# Patient Record
Sex: Male | Born: 1945 | Race: White | Hispanic: No | Marital: Married | Smoking: Current some day smoker
Health system: Southern US, Community
[De-identification: ages and names within clinical notes are randomized; demographics above are authoritative.]

## PROBLEM LIST (undated history)

## (undated) DIAGNOSIS — E785 Hyperlipidemia, unspecified: Secondary | ICD-10-CM

## (undated) DIAGNOSIS — F17200 Nicotine dependence, unspecified, uncomplicated: Secondary | ICD-10-CM

## (undated) DIAGNOSIS — H409 Unspecified glaucoma: Secondary | ICD-10-CM

## (undated) DIAGNOSIS — I10 Essential (primary) hypertension: Secondary | ICD-10-CM

## (undated) DIAGNOSIS — C801 Malignant (primary) neoplasm, unspecified: Secondary | ICD-10-CM

## (undated) DIAGNOSIS — Z87891 Personal history of nicotine dependence: Principal | ICD-10-CM

## (undated) HISTORY — DX: Essential (primary) hypertension: I10

## (undated) HISTORY — DX: Personal history of nicotine dependence: Z87.891

## (undated) HISTORY — DX: Hyperlipidemia, unspecified: E78.5

## (undated) HISTORY — DX: Malignant (primary) neoplasm, unspecified: C80.1

## (undated) HISTORY — PX: EYE SURGERY: SHX253

## (undated) HISTORY — PX: RETINAL DETACHMENT SURGERY: SHX105

## (undated) HISTORY — DX: Nicotine dependence, unspecified, uncomplicated: F17.200

---

## 2007-01-23 ENCOUNTER — Ambulatory Visit: Payer: Self-pay | Admitting: Gastroenterology

## 2007-01-23 LAB — HM COLONOSCOPY

## 2009-01-15 ENCOUNTER — Ambulatory Visit: Payer: Self-pay | Admitting: Ophthalmology

## 2009-01-19 ENCOUNTER — Ambulatory Visit: Payer: Self-pay | Admitting: Ophthalmology

## 2009-02-09 ENCOUNTER — Ambulatory Visit: Payer: Self-pay | Admitting: Ophthalmology

## 2010-02-03 ENCOUNTER — Ambulatory Visit: Payer: Self-pay

## 2011-03-09 ENCOUNTER — Ambulatory Visit: Payer: Self-pay

## 2011-03-11 ENCOUNTER — Ambulatory Visit: Payer: Self-pay

## 2011-03-22 ENCOUNTER — Ambulatory Visit: Payer: Self-pay

## 2011-09-21 ENCOUNTER — Ambulatory Visit: Payer: Self-pay | Admitting: Ophthalmology

## 2011-09-21 LAB — POTASSIUM: Potassium: 3.9 mmol/L (ref 3.5–5.1)

## 2012-03-19 ENCOUNTER — Ambulatory Visit: Payer: Self-pay

## 2012-06-28 HISTORY — PX: PROSTATE SURGERY: SHX751

## 2013-01-21 LAB — PSA

## 2014-10-12 NOTE — Op Note (Signed)
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   PROCEDURES PERFORMED:  1. Pars plana vitrectomy of the left eye.  2. Retinal detachment of the left eye.  3. Gas exchange of the left eye.  4. Endolaser of the left eye.   SURGEON: Garlan Fair, MD  ESTIMATED BLOOD LOSS: Less than 1 mL.  ANESTHESIA: Retrobulbar block of the left eye with monitored anesthesia care.   COMPLICATIONS: None.   INDICATIONS FOR PROCEDURE: This is a patient who presented to my office with a curtain effect of his left eye. 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 Risks, benefits, and alternatives of the above procedure were discussed and the patient wished to proceed.   DESCRIPTION OF PROCEDURE: Upon informed consent, the patient was brought into the operative suite at [facility]. The patient was placed in supine position and was given a small dose of Alfenta and a retrobulbar block was performed on the left eye by the primary surgeon without any complications. The left eye was prepped and draped in sterile manner. After lid speculum was inserted, a 25-gauge trocar was placed inferotemporally through displaced conjunctiva, in an oblique fashion. The infusion cannula was turned on and inserted through the trocar and secured into position with Steri-Strips. Two more trocars were placed in a similar fashion superotemporally and superonasally. The vitreous cutter and light pipe were introduced into the eye and a core vitrectomy was performed. The vitreous face was confirmed as elevated and the vitreous was trimmed for 360 degrees out to the ora serrata. Extreme care was taken over the area of the  retinal detachment. Two small retinal breaks were identified and marked using endo cautery. The vitreous cutter was used to amputate the flap on each of these tears to make sure that there was no traction. Endo cautery was used to create a draining retinotomy superonasal to the arcades. The soft tip cannula was introduced, an air-fluid exchange was performed, and the retina was completely flattened through the draining retinotomy. Endolaser was introduced and five rows of laser were placed around the original two retinal breaks. The laser was then carried for three rows, for 360 degrees, posterior to the ora serrata and around the multiple areas of lattice degeneration. The soft tip was reintroduced, any remnant fluid was removed through the draining retinotomy, and four rows of laser was placed around the draining retinotomy. Any remnant fluid was removed and then an air gas exchange using 22% SF6 was used. The trocars were removed and two of the trocar sites were noted to be slightly leaky. These were closed using interrupted 6-0 plain gut. The eye was pressurized with SF6 to approximately 15 mmHg. 5 mg of dexamethasone was given into the inferior fornix and the lid speculum was removed. The eye was cleaned and TobraDex was placed on the eye. The patient was taken to postanesthesia care with instructions to remain head. ____________________________ Richard Henry. Richard Manns, MD mfa:slb D: 09/21/2011 14:45:03 ET T: 09/21/2011 15:12:20 ET JOB#: [id number]  cc: Richard Henry. Richard Manns, MD, <Dictator> Coralee Rud MD ELECTRONICALLY SIGNED 10/12/2011 7:18

## 2015-05-05 ENCOUNTER — Other Ambulatory Visit: Payer: Self-pay | Admitting: Unknown Physician Specialty

## 2015-05-05 ENCOUNTER — Other Ambulatory Visit: Payer: Self-pay

## 2015-05-05 MED ORDER — ATORVASTATIN CALCIUM 20 MG PO TABS: 20.0000 mg | ORAL_TABLET | Freq: Every day | ORAL | Status: DC

## 2015-05-05 NOTE — Telephone Encounter (Signed)
Pharmacy called stating that patient is saying he is completely out of medication. Patient has appointment 05/27/15 and wants to get 30 day supply sent to [facility] to get to his appointment. States he usually uses mail order pharmacy but since he is out, he needs it now.

## 2015-05-06 DIAGNOSIS — F17200 Nicotine dependence, unspecified, uncomplicated: Secondary | ICD-10-CM

## 2015-05-06 DIAGNOSIS — J449 Chronic obstructive pulmonary disease, unspecified: Secondary | ICD-10-CM

## 2015-05-06 DIAGNOSIS — J432 Centrilobular emphysema: Secondary | ICD-10-CM | POA: Insufficient documentation

## 2015-05-06 DIAGNOSIS — E785 Hyperlipidemia, unspecified: Secondary | ICD-10-CM | POA: Insufficient documentation

## 2015-05-06 DIAGNOSIS — I10 Essential (primary) hypertension: Secondary | ICD-10-CM | POA: Insufficient documentation

## 2015-05-06 DIAGNOSIS — F17210 Nicotine dependence, cigarettes, uncomplicated: Secondary | ICD-10-CM | POA: Insufficient documentation

## 2015-05-06 DIAGNOSIS — R972 Elevated prostate specific antigen [PSA]: Secondary | ICD-10-CM | POA: Insufficient documentation

## 2015-05-06 DIAGNOSIS — C61 Malignant neoplasm of prostate: Secondary | ICD-10-CM | POA: Insufficient documentation

## 2015-05-25 ENCOUNTER — Encounter: Payer: Self-pay | Admitting: Unknown Physician Specialty

## 2015-06-23 ENCOUNTER — Other Ambulatory Visit: Payer: Self-pay | Admitting: Family Medicine

## 2015-06-23 NOTE — Telephone Encounter (Signed)
Needs check further refills 

## 2015-06-23 NOTE — Telephone Encounter (Signed)
Your patient 

## 2015-06-23 NOTE — Telephone Encounter (Signed)
Called and scheduled patient an appointment for 07/14/15.

## 2015-07-14 ENCOUNTER — Ambulatory Visit (INDEPENDENT_AMBULATORY_CARE_PROVIDER_SITE_OTHER): Payer: Medicare Other | Admitting: Unknown Physician Specialty

## 2015-07-14 ENCOUNTER — Encounter: Payer: Self-pay | Admitting: Unknown Physician Specialty

## 2015-07-14 VITALS — BP 120/76 | HR 80 | Temp 97.9°F | Ht 70.1 in | Wt 189.6 lb

## 2015-07-14 DIAGNOSIS — Z23 Encounter for immunization: Secondary | ICD-10-CM | POA: Diagnosis not present

## 2015-07-14 DIAGNOSIS — E785 Hyperlipidemia, unspecified: Secondary | ICD-10-CM | POA: Diagnosis not present

## 2015-07-14 DIAGNOSIS — I10 Essential (primary) hypertension: Secondary | ICD-10-CM | POA: Diagnosis not present

## 2015-07-14 DIAGNOSIS — Z0000 Encounter for general adult medical examination without abnormal findings: Secondary | ICD-10-CM | POA: Diagnosis not present

## 2015-07-14 DIAGNOSIS — F17200 Nicotine dependence, unspecified, uncomplicated: Secondary | ICD-10-CM | POA: Diagnosis not present

## 2015-07-14 LAB — MICROALBUMIN, URINE WAIVED
CREATININE, URINE WAIVED: 200 mg/dL (ref 10–300)
MICROALB, UR WAIVED: 10 mg/L (ref 0–19)
Microalb/Creat Ratio: <30 mg/g (ref <30)

## 2015-07-14 NOTE — Assessment & Plan Note (Addendum)
Smoking periodically.  Encouraged to quit.  Set up for low dose CT screening

## 2015-07-14 NOTE — Assessment & Plan Note (Signed)
Check lipid panel  

## 2015-07-14 NOTE — Progress Notes (Signed)
BP 120/76 mmHg  Pulse 80  Temp(Src) 97.9 F (36.6 C)  Ht 5' 10.1" (1.781 m)  Wt 189 lb 9.6 oz (86.002 kg)  BMI 27.11 kg/m2  SpO2 96%   Subjective:    Patient ID: Richard Henry, male    DOB: [birth year], 70 y.o.   MRN: [id number]  HPI: Richard Henry is a 70 y.o. male  Chief Complaint  Patient presents with  . Medicare Wellness    pt would like to go get shingles vaccine   Functional Status Survey: Is the patient deaf or have difficulty hearing?: No Does the patient have difficulty seeing, even when wearing glasses/contacts?: No Does the patient have difficulty concentrating, remembering, or making decisions?: Yes (pt states he has trouble remembering sometimes) Does the patient have difficulty walking or climbing stairs?: No Does the patient have difficulty dressing or bathing?: No Does the patient have difficulty doing errands alone such as visiting a doctor's office or shopping?: No  Fall Risk  07/14/2015  Falls in the past year? No   Fall Risk  07/14/2015  Falls in the past year? No   Hypertension Using medications without difficulty Average home BPs   No problems or lightheadedness No chest pain with exertion or shortness of breath No Edema  Hyperlipidemia Using medications without problems: No Muscle aches  Diet compliance: good diet Exercise: Stays active.   Relevant past medical, surgical, family and social history reviewed and updated as indicated. Interim medical history since our last visit reviewed. Allergies and medications reviewed and updated.  Review of Systems  Constitutional: Negative.   HENT: Negative.   Eyes: Negative.   Respiratory: Negative.   Cardiovascular: Negative.   Gastrointestinal: Negative.   Endocrine: Negative.   Genitourinary: Negative.   Skin: Negative.   Allergic/Immunologic: Negative.   Neurological: Negative.   Hematological: Negative.   Psychiatric/Behavioral: Negative.     Per HPI unless specifically indicated  above     Objective:    BP 120/76 mmHg  Pulse 80  Temp(Src) 97.9 F (36.6 C)  Ht 5' 10.1" (1.781 m)  Wt 189 lb 9.6 oz (86.002 kg)  BMI 27.11 kg/m2  SpO2 96%  Wt Readings from Last 3 Encounters:  07/14/15 189 lb 9.6 oz (86.002 kg)  11/18/14 187 lb (84.823 kg)    Physical Exam  Constitutional: He is oriented to person, place, and time. He appears well-developed and well-nourished.  HENT:  Head: Normocephalic.  Right Ear: Tympanic membrane, external ear and ear canal normal.  Left Ear: Tympanic membrane, external ear and ear canal normal.  Mouth/Throat: Uvula is midline, oropharynx is clear and moist and mucous membranes are normal.  Eyes: Pupils are equal, round, and reactive to light.  Cardiovascular: Normal rate, regular rhythm and normal heart sounds.  Exam reveals no gallop and no friction rub.   No murmur heard. Pulmonary/Chest: Effort normal and breath sounds normal. No respiratory distress.  Abdominal: Soft. Bowel sounds are normal. He exhibits no distension. There is no tenderness.  Musculoskeletal: Normal range of motion.  Neurological: He is alert and oriented to person, place, and time. He has normal reflexes.  Skin: Skin is warm and dry.  Psychiatric: He has a normal mood and affect. His behavior is normal. Judgment and thought content normal.      Assessment & Plan:   Problem List Items Addressed This Visit      Unprioritized   Tobacco use disorder    Smoking periodically.  Encouraged to quit.  Set up for  low dose CT screening      Hypertension   Relevant Orders   Comprehensive metabolic panel   Microalbumin, Urine Waived   Uric acid   Hyperlipidemia    Check lipid panel      Relevant Orders   Lipid Panel w/o Chol/HDL Ratio    Other Visit Diagnoses    Immunization due    -  Primary    Relevant Orders    Flu Vaccine QUAD 36+ mos IM (Completed)    Annual physical exam        Relevant Orders    Hepatitis C antibody         Follow up  plan: Return in about 6 months (around 01/11/2016).

## 2015-07-15 LAB — COMPREHENSIVE METABOLIC PANEL
A/G RATIO: 1.6 (ref 1.1–2.5)
ALBUMIN: 3.9 g/dL (ref 3.6–4.8)
ALK PHOS: 45 IU/L (ref 39–117)
ALT: 30 IU/L (ref 0–44)
AST: 31 IU/L (ref 0–40)
BILIRUBIN TOTAL: 0.4 mg/dL (ref 0.0–1.2)
BUN / CREAT RATIO: 17 (ref 10–22)
BUN: 19 mg/dL (ref 8–27)
CO2: 27 mmol/L (ref 18–29)
CREATININE: 1.1 mg/dL (ref 0.76–1.27)
Calcium: 9.5 mg/dL (ref 8.6–10.2)
Chloride: 103 mmol/L (ref 96–106)
GFR calc Af Amer: 79 mL/min/{1.73_m2} (ref >59)
GFR calc non Af Amer: 69 mL/min/{1.73_m2} (ref >59)
GLOBULIN, TOTAL: 2.5 g/dL (ref 1.5–4.5)
Glucose: 87 mg/dL (ref 65–99)
POTASSIUM: 4.7 mmol/L (ref 3.5–5.2)
SODIUM: 144 mmol/L (ref 134–144)
Total Protein: 6.4 g/dL (ref 6.0–8.5)

## 2015-07-15 LAB — LIPID PANEL W/O CHOL/HDL RATIO
CHOLESTEROL TOTAL: 180 mg/dL (ref 100–199)
HDL: 65 mg/dL (ref >39)
LDL Calculated: 94 mg/dL (ref 0–99)
Triglycerides: 106 mg/dL (ref 0–149)
VLDL CHOLESTEROL CAL: 21 mg/dL (ref 5–40)

## 2015-07-15 LAB — URIC ACID: Uric Acid: 6.5 mg/dL (ref 3.7–8.6)

## 2015-07-15 LAB — HEPATITIS C ANTIBODY

## 2015-07-20 ENCOUNTER — Telehealth: Payer: Self-pay | Admitting: *Deleted

## 2015-07-20 NOTE — Telephone Encounter (Signed)
Received referral for low dose lung cancer screening CT scan from [facility] . Voicemail left at phone number listed in EMR for patient to call me back to facilitate scheduling scan.

## 2015-08-03 ENCOUNTER — Telehealth: Payer: Self-pay | Admitting: *Deleted

## 2015-08-03 NOTE — Telephone Encounter (Signed)
Received referral for low dose lung cancer screening CT scan from [facility] . Voicemail left at phone number listed in EMR for patient to call me back to facilitate scheduling scan.

## 2015-08-06 ENCOUNTER — Other Ambulatory Visit: Payer: Self-pay | Admitting: Family Medicine

## 2015-08-06 ENCOUNTER — Encounter: Payer: Self-pay | Admitting: Family Medicine

## 2015-08-06 DIAGNOSIS — Z87891 Personal history of nicotine dependence: Secondary | ICD-10-CM

## 2015-08-06 HISTORY — DX: Personal history of nicotine dependence: Z87.891

## 2015-08-07 ENCOUNTER — Inpatient Hospital Stay: Payer: Medicare Other | Attending: Family Medicine | Admitting: Family Medicine

## 2015-08-07 ENCOUNTER — Encounter: Payer: Self-pay | Admitting: Family Medicine

## 2015-08-07 ENCOUNTER — Ambulatory Visit: Payer: Self-pay

## 2015-08-07 ENCOUNTER — Ambulatory Visit
Admission: RE | Admit: 2015-08-07 | Discharge: 2015-08-07 | Disposition: A | Discharge status: Home / Self Care | Payer: Medicare Other | Source: Ambulatory Visit | Attending: Family Medicine | Admitting: Family Medicine

## 2015-08-07 DIAGNOSIS — Z87891 Personal history of nicotine dependence: Secondary | ICD-10-CM

## 2015-08-07 DIAGNOSIS — F17210 Nicotine dependence, cigarettes, uncomplicated: Secondary | ICD-10-CM | POA: Diagnosis not present

## 2015-08-07 DIAGNOSIS — Z122 Encounter for screening for malignant neoplasm of respiratory organs: Secondary | ICD-10-CM | POA: Diagnosis not present

## 2015-08-07 NOTE — Progress Notes (Signed)
In accordance with CMS guidelines, patient has meet eligibility criteria including age, absence of signs or symptoms of lung cancer, the specific calculation of cigarette smoking pack-years was 35 years and is a current smoker.   A shared decision-making session was conducted prior to the performance of CT scan. This includes one or more decision aids, includes benefits and harms of screening, follow-up diagnostic testing, over-diagnosis, false positive rate, and total radiation exposure.  Counseling on the importance of adherence to annual lung cancer LDCT screening, impact of co-morbidities, and ability or willingness to undergo diagnosis and treatment is imperative for compliance of the program.  Counseling on the importance of continued smoking cessation for former smokers; the importance of smoking cessation for current smokers and information about tobacco cessation interventions have been given to patient including the [place] at [facility], 1800 quit [place], as well as Cancer Center specific smoking cessation programs.  Written order for lung cancer screening with LDCT has been given to the patient and any and all questions have been answered to the best of my abilities.   Yearly follow up will be scheduled by Shawn Perkins, Thoracic Navigator.   

## 2015-08-15 ENCOUNTER — Other Ambulatory Visit: Payer: Self-pay | Admitting: Family Medicine

## 2015-08-15 MED ORDER — OSELTAMIVIR PHOSPHATE 75 MG PO CAPS: 75.0000 mg | ORAL_CAPSULE | Freq: Every day | ORAL | Status: DC

## 2015-08-15 NOTE — Progress Notes (Signed)
I called husband, explained wife (my patient) has influenza A, let's start him on Tamiflu 75 mg daily x 10 days, if he gets sick, then call us and switch to BID dosing; confirmed pharmacy

## 2015-08-26 ENCOUNTER — Other Ambulatory Visit: Payer: Self-pay

## 2015-08-26 MED ORDER — HYDROCHLOROTHIAZIDE 12.5 MG PO CAPS: 12.5000 mg | ORAL_CAPSULE | Freq: Every day | ORAL | Status: DC

## 2015-08-26 NOTE — Telephone Encounter (Signed)
Patient was last seen 07/14/15 and has appointment 01/12/16. Pharmacy is [facility].

## 2015-09-17 ENCOUNTER — Ambulatory Visit: Payer: Medicare Other | Attending: Family Medicine

## 2015-10-12 ENCOUNTER — Other Ambulatory Visit: Payer: Self-pay | Admitting: Unknown Physician Specialty

## 2015-10-20 ENCOUNTER — Telehealth: Payer: Self-pay | Admitting: *Deleted

## 2015-10-20 NOTE — Telephone Encounter (Signed)
Contacted patient who previously had shared decision making visit for lung cancer screening but has not had CT scan yet due to health issues with wife. Informed that outpatient imaging center will accommodate him any time he can come in for scan.

## 2015-11-24 ENCOUNTER — Other Ambulatory Visit: Payer: Self-pay | Admitting: Unknown Physician Specialty

## 2015-11-24 MED ORDER — ATORVASTATIN CALCIUM 20 MG PO TABS: 20.0000 mg | ORAL_TABLET | Freq: Every day | ORAL | Status: DC

## 2015-11-24 NOTE — Telephone Encounter (Signed)
Routing to provider. Patient was seen 07/14/15 and has appointment scheduled 01/12/16. Pharmacy is [facility].

## 2015-11-24 NOTE — Telephone Encounter (Signed)
Pt called stated he needs a new RX for Atorvastatin to [facility]. The old prescription has expired. Thanks.

## 2016-01-12 ENCOUNTER — Ambulatory Visit (INDEPENDENT_AMBULATORY_CARE_PROVIDER_SITE_OTHER): Payer: Medicare Other | Admitting: Unknown Physician Specialty

## 2016-01-12 ENCOUNTER — Encounter: Payer: Self-pay | Admitting: Unknown Physician Specialty

## 2016-01-12 DIAGNOSIS — I10 Essential (primary) hypertension: Secondary | ICD-10-CM | POA: Diagnosis not present

## 2016-01-12 DIAGNOSIS — E785 Hyperlipidemia, unspecified: Secondary | ICD-10-CM

## 2016-01-12 MED ORDER — ATORVASTATIN CALCIUM 20 MG PO TABS
20.0000 mg | ORAL_TABLET | Freq: Every day | ORAL | 1 refills | Status: DC
Start: 2016-01-12 — End: 2016-06-28

## 2016-01-12 MED ORDER — HYDROCHLOROTHIAZIDE 12.5 MG PO CAPS: 12.5000 mg | ORAL_CAPSULE | Freq: Every day | ORAL | 1 refills | Status: DC

## 2016-01-12 NOTE — Addendum Note (Signed)
Addended by: Kathrine Haddock on: 01/12/2016 08:48 AM   Modules accepted: Orders

## 2016-01-12 NOTE — Progress Notes (Addendum)
BP 134/86 (BP Location: Left Arm, Patient Position: Sitting, Cuff Size: Normal)   Pulse 62   Temp 97.6 F (36.4 C)   Ht 5' 10.1" (1.781 m)   Wt 176 lb 9.6 oz (80.1 kg)   SpO2 98%   BMI 25.27 kg/m    Subjective:    Patient ID: Richard Henry, male    DOB: [birth year], 70 y.o.   MRN: [id number]  HPI: Richard Henry is a 70 y.o. male  Chief Complaint  Patient presents with  . Hyperlipidemia  . Hypertension   Hypertension Using medications without difficulty Average home BPs   No problems or lightheadedness No chest pain with exertion or shortness of breath No Edema   Hyperlipidemia Using medications without problems No Muscle aches  Diet compliance: Not eating as well.   Exercise: "less in the last 6 months duet to wife's illness  Relevant past medical, surgical, family and social history reviewed and updated as indicated. Interim medical history since our last visit reviewed. Allergies and medications reviewed and updated.  Review of Systems  Per HPI unless specifically indicated above     Objective:    BP 134/86 (BP Location: Left Arm, Patient Position: Sitting, Cuff Size: Normal)   Pulse 62   Temp 97.6 F (36.4 C)   Ht 5' 10.1" (1.781 m)   Wt 176 lb 9.6 oz (80.1 kg)   SpO2 98%   BMI 25.27 kg/m   Wt Readings from Last 3 Encounters:  01/12/16 176 lb 9.6 oz (80.1 kg)  07/14/15 189 lb 9.6 oz (86 kg)  11/18/14 187 lb (84.8 kg)    Physical Exam  Constitutional: He is oriented to person, place, and time. He appears well-developed and well-nourished. No distress.  HENT:  Head: Normocephalic and atraumatic.  Eyes: Conjunctivae and lids are normal. Right eye exhibits no discharge. Left eye exhibits no discharge. No scleral icterus.  Neck: Normal range of motion. Neck supple. No JVD present. Carotid bruit is not present.  Cardiovascular: Normal rate, regular rhythm and normal heart sounds.   Pulmonary/Chest: Effort normal and breath sounds normal. No respiratory  distress.  Abdominal: Normal appearance. There is no splenomegaly or hepatomegaly.  Musculoskeletal: Normal range of motion.  Neurological: He is alert and oriented to person, place, and time.  Skin: Skin is warm, dry and intact. No rash noted. No pallor.  Psychiatric: He has a normal mood and affect. His behavior is normal. Judgment and thought content normal.    Results for orders placed or performed in visit on 07/14/15  Comprehensive metabolic panel  Result Value Ref Range   Glucose 87 65 - 99 mg/dL   BUN 19 8 - 27 mg/dL   Creatinine, Ser 1.10 0.76 - 1.27 mg/dL   GFR calc non Af Amer 69 >59 mL/min/1.73   GFR calc Af Amer 79 >59 mL/min/1.73   BUN/Creatinine Ratio 17 10 - 22   Sodium 144 134 - 144 mmol/L   Potassium 4.7 3.5 - 5.2 mmol/L   Chloride 103 96 - 106 mmol/L   CO2 27 18 - 29 mmol/L   Calcium 9.5 8.6 - 10.2 mg/dL   Total Protein 6.4 6.0 - 8.5 g/dL   Albumin 3.9 3.6 - 4.8 g/dL   Globulin, Total 2.5 1.5 - 4.5 g/dL   Albumin/Globulin Ratio 1.6 1.1 - 2.5   Bilirubin Total 0.4 0.0 - 1.2 mg/dL   Alkaline Phosphatase 45 39 - 117 IU/L   AST 31 0 - 40 IU/L   ALT  30 0 - 44 IU/L  Hepatitis C antibody  Result Value Ref Range   Hep C Virus Ab <0.1 0.0 - 0.9 s/co ratio  Lipid Panel w/o Chol/HDL Ratio  Result Value Ref Range   Cholesterol, Total 180 100 - 199 mg/dL   Triglycerides 106 0 - 149 mg/dL   HDL 65 >39 mg/dL   VLDL Cholesterol Cal 21 5 - 40 mg/dL   LDL Calculated 94 0 - 99 mg/dL  Microalbumin, Urine Waived  Result Value Ref Range   Microalb, Ur Waived 10 0 - 19 mg/L   Creatinine, Urine Waived 200 10 - 300 mg/dL   Microalb/Creat Ratio <30 <30 mg/g  Uric acid  Result Value Ref Range   Uric Acid 6.5 3.7 - 8.6 mg/dL      Assessment & Plan:   Problem List Items Addressed This Visit      Unprioritized   Hyperlipidemia    Stable, continue present medications.        Hypertension    Stable, continue present medications.         Other Visit Diagnoses     None.      Follow up plan: Return in about 6 months (around 07/14/2016) for physica;.

## 2016-01-12 NOTE — Assessment & Plan Note (Signed)
Stable, continue present medications.   

## 2016-06-28 ENCOUNTER — Other Ambulatory Visit: Payer: Self-pay | Admitting: Unknown Physician Specialty

## 2016-07-18 ENCOUNTER — Ambulatory Visit (INDEPENDENT_AMBULATORY_CARE_PROVIDER_SITE_OTHER): Payer: Medicare Other | Admitting: Unknown Physician Specialty

## 2016-07-18 ENCOUNTER — Encounter: Payer: Self-pay | Admitting: Unknown Physician Specialty

## 2016-07-18 VITALS — BP 131/79 | HR 77 | Temp 97.5°F | Ht 70.3 in | Wt 183.2 lb

## 2016-07-18 DIAGNOSIS — E782 Mixed hyperlipidemia: Secondary | ICD-10-CM

## 2016-07-18 DIAGNOSIS — Z8546 Personal history of malignant neoplasm of prostate: Secondary | ICD-10-CM | POA: Diagnosis not present

## 2016-07-18 DIAGNOSIS — Z0000 Encounter for general adult medical examination without abnormal findings: Secondary | ICD-10-CM

## 2016-07-18 DIAGNOSIS — I10 Essential (primary) hypertension: Secondary | ICD-10-CM

## 2016-07-18 DIAGNOSIS — Z0001 Encounter for general adult medical examination with abnormal findings: Secondary | ICD-10-CM | POA: Diagnosis not present

## 2016-07-18 DIAGNOSIS — Z7189 Other specified counseling: Secondary | ICD-10-CM

## 2016-07-18 NOTE — Progress Notes (Signed)
BP 131/79 (BP Location: Left Arm, Patient Position: Sitting, Cuff Size: Large)   Pulse 77   Temp 97.5 F (36.4 C)   Ht 5' 10.3" (1.786 m)   Wt 183 lb 3.2 oz (83.1 kg)   SpO2 97%   BMI 26.06 kg/m    Subjective:    Patient ID: Richard Henry, male    DOB: [birth year], 71 y.o.   MRN: [id number]  HPI: Richard Henry is a 71 y.o. male  Chief Complaint  Patient presents with  . Medicare Wellness   Functional Status Survey: Is the patient deaf or have difficulty hearing?: No Does the patient have difficulty seeing, even when wearing glasses/contacts?: No Does the patient have difficulty concentrating, remembering, or making decisions?: Yes (remembering) Does the patient have difficulty walking or climbing stairs?: No Does the patient have difficulty dressing or bathing?: No Does the patient have difficulty doing errands alone such as visiting a doctor's office or shopping?: No  Fall Risk  07/18/2016 07/14/2015  Falls in the past year? No No   Social History   Social History  . Marital status: Married    Spouse name: N/A  . Number of children: N/A  . Years of education: N/A   Occupational History  . Not on file.   Social History Main Topics  . Smoking status: Current Some Day Smoker    Packs/day: 0.00    Years: 35.00  . Smokeless tobacco: Never Used  . Alcohol use 6.0 oz/week    10 Cans of beer per week  . Drug use: No  . Sexual activity: Yes   Other Topics Concern  . Not on file   Social History Narrative  . No narrative on file   Family History  Problem Relation Age of Onset  . Hypertension Mother   . Osteoporosis Mother   . Cancer Father     lung  . Hypertension Father    Past Medical History:  Diagnosis Date  . Cancer [facility])    prostate  . COPD (chronic obstructive pulmonary disease) ([place])   . Hyperlipidemia   . Hypertension   . Personal history of tobacco use, presenting hazards to health 08/06/2015  . Tobacco use disorder    Tobacco Cut back on  smoking.  Had a low dose CT scheduled, charged for, and never got it.    Hypertension Using medications without difficulty Average home BPs Not checking  No problems or lightheadedness No chest pain with exertion or shortness of breath No Edema  Hyperlipidemia Using medications without problems: No Muscle aches  Diet compliance/Exercise: Not exercising but good diet  Relevant past medical, surgical, family and social history reviewed and updated as indicated. Interim medical history since our last visit reviewed. Allergies and medications reviewed and updated.  Review of Systems  Constitutional: Negative.   HENT: Negative.   Eyes: Negative.   Respiratory: Negative.   Cardiovascular: Negative.   Gastrointestinal: Negative.   Endocrine: Negative.   Genitourinary: Negative.   Skin: Negative.   Allergic/Immunologic: Negative.   Neurological: Negative.   Hematological: Negative.   Psychiatric/Behavioral: Negative.     Per HPI unless specifically indicated above     Objective:    BP 131/79 (BP Location: Left Arm, Patient Position: Sitting, Cuff Size: Large)   Pulse 77   Temp 97.5 F (36.4 C)   Ht 5' 10.3" (1.786 m)   Wt 183 lb 3.2 oz (83.1 kg)   SpO2 97%   BMI 26.06 kg/m   Wt Readings  from Last 3 Encounters:  07/18/16 183 lb 3.2 oz (83.1 kg)  01/12/16 176 lb 9.6 oz (80.1 kg)  07/14/15 189 lb 9.6 oz (86 kg)    Physical Exam  Constitutional: He is oriented to person, place, and time. He appears well-developed and well-nourished.  HENT:  Head: Normocephalic.  Right Ear: Tympanic membrane, external ear and ear canal normal.  Left Ear: Tympanic membrane, external ear and ear canal normal.  Mouth/Throat: Uvula is midline, oropharynx is clear and moist and mucous membranes are normal.  Eyes: Pupils are equal, round, and reactive to light.  Cardiovascular: Normal rate, regular rhythm and normal heart sounds.  Exam reveals no gallop and no friction rub.   No murmur  heard. Pulmonary/Chest: Effort normal and breath sounds normal. No respiratory distress.  Abdominal: Soft. Bowel sounds are normal. He exhibits no distension. There is no tenderness.  Musculoskeletal: Normal range of motion.  Neurological: He is alert and oriented to person, place, and time. He has normal reflexes.  Skin: Skin is warm and dry.  Psychiatric: He has a normal mood and affect. His behavior is normal. Judgment and thought content normal.    Assessment & Plan:   Problem List Items Addressed This Visit      Unprioritized   Advanced care planning/counseling discussion - Primary    Wife is current health care proxy but she would not be able to make decisions for him and needs another.  He has a living will.  No DNR.  Materials given.  Offered ACP visit      Hyperlipidemia    Stable, continue present medications.        Relevant Orders   Lipid Panel w/o Chol/HDL Ratio   Hypertension    Stable, continue present medications.        Relevant Orders   Comprehensive metabolic panel    Other Visit Diagnoses    Routine general medical examination at a health care facility       History of prostate cancer       Relevant Orders   PSA       Follow up plan: Return in about 6 months (around 01/15/2017).

## 2016-07-18 NOTE — Assessment & Plan Note (Addendum)
Wife is current health care proxy but she would not be able to make decisions for him and needs another.  He has a living will.  No DNR.  Materials given.  Offered ACP visit

## 2016-07-18 NOTE — Assessment & Plan Note (Signed)
Stable, continue present medications.   

## 2016-07-19 ENCOUNTER — Encounter: Payer: Self-pay | Admitting: Unknown Physician Specialty

## 2016-07-19 ENCOUNTER — Telehealth: Payer: Self-pay | Admitting: Unknown Physician Specialty

## 2016-07-19 LAB — COMPREHENSIVE METABOLIC PANEL
ALBUMIN: 3.9 g/dL (ref 3.6–4.8)
ALK PHOS: 55 IU/L (ref 39–117)
ALT: 17 IU/L (ref 0–44)
AST: 17 IU/L (ref 0–40)
Albumin/Globulin Ratio: 1.7 (ref 1.2–2.2)
BILIRUBIN TOTAL: 0.6 mg/dL (ref 0.0–1.2)
BUN / CREAT RATIO: 11 (ref 10–24)
BUN: 14 mg/dL (ref 8–27)
CHLORIDE: 101 mmol/L (ref 96–106)
CO2: 26 mmol/L (ref 18–29)
Calcium: 9.2 mg/dL (ref 8.6–10.2)
Creatinine, Ser: 1.23 mg/dL (ref 0.76–1.27)
GFR calc Af Amer: 69 mL/min/{1.73_m2} (ref >59)
GFR calc non Af Amer: 60 mL/min/{1.73_m2} (ref >59)
GLUCOSE: 98 mg/dL (ref 65–99)
Globulin, Total: 2.3 g/dL (ref 1.5–4.5)
Potassium: 4.3 mmol/L (ref 3.5–5.2)
Sodium: 139 mmol/L (ref 134–144)
Total Protein: 6.2 g/dL (ref 6.0–8.5)

## 2016-07-19 LAB — LIPID PANEL W/O CHOL/HDL RATIO
CHOLESTEROL TOTAL: 174 mg/dL (ref 100–199)
HDL: 50 mg/dL (ref >39)
LDL Calculated: 101 mg/dL — ABNORMAL HIGH (ref 0–99)
Triglycerides: 115 mg/dL (ref 0–149)
VLDL Cholesterol Cal: 23 mg/dL (ref 5–40)

## 2016-07-19 LAB — PSA: PROSTATE SPECIFIC AG, SERUM: 0.1 ng/mL (ref 0.0–4.0)

## 2016-07-19 NOTE — Telephone Encounter (Signed)
Labs printed and faxed

## 2016-07-19 NOTE — Progress Notes (Signed)
Normal labs.  Patient notified by letter.

## 2016-07-19 NOTE — Telephone Encounter (Signed)
Carmelina Noun, PA from [facility] needs the patients PSA levels faxed over to her.  Fax [phone]  Thank You

## 2016-07-20 ENCOUNTER — Telehealth: Payer: Self-pay | Admitting: Unknown Physician Specialty

## 2016-07-20 NOTE — Telephone Encounter (Signed)
Labs printed and faxed

## 2016-08-31 ENCOUNTER — Other Ambulatory Visit: Payer: Self-pay | Admitting: Family Medicine

## 2016-09-08 ENCOUNTER — Other Ambulatory Visit: Payer: Self-pay | Admitting: Unknown Physician Specialty

## 2017-01-16 ENCOUNTER — Ambulatory Visit (INDEPENDENT_AMBULATORY_CARE_PROVIDER_SITE_OTHER): Payer: Medicare Other | Admitting: Unknown Physician Specialty

## 2017-01-16 ENCOUNTER — Encounter: Payer: Self-pay | Admitting: Unknown Physician Specialty

## 2017-01-16 VITALS — BP 148/86 | HR 64 | Temp 97.8°F | Ht 70.7 in | Wt 189.8 lb

## 2017-01-16 DIAGNOSIS — E782 Mixed hyperlipidemia: Secondary | ICD-10-CM

## 2017-01-16 DIAGNOSIS — R252 Cramp and spasm: Secondary | ICD-10-CM

## 2017-01-16 DIAGNOSIS — I10 Essential (primary) hypertension: Secondary | ICD-10-CM | POA: Diagnosis not present

## 2017-01-16 MED ORDER — LISINOPRIL-HYDROCHLOROTHIAZIDE 10-12.5 MG PO TABS: 1.0000 | ORAL_TABLET | Freq: Every day | ORAL | 1 refills | Status: DC

## 2017-01-16 NOTE — Assessment & Plan Note (Signed)
Temporary trial off of Atorvastatin.

## 2017-01-16 NOTE — Patient Instructions (Addendum)
Magnesium Citrate or Magnesium Glycinate at night  Stop Atorvastatin for about 2 weeks and see if muscle cramps improve

## 2017-01-16 NOTE — Progress Notes (Signed)
BP (!) 148/86 (BP Location: Left Arm, Cuff Size: Normal)   Pulse 64   Temp 97.8 F (36.6 C)   Ht 5' 10.7" (1.796 m)   Wt 189 lb 12.8 oz (86.1 kg)   SpO2 97%   BMI 26.70 kg/m    Subjective:    Patient ID: Richard Henry, male    DOB: 01/20/1946, 71 y.o.   MRN: [id number]  HPI: Richard Henry is a 71 y.o. male  Chief Complaint  Patient presents with  . Hyperlipidemia  . Hypertension   Hypertension Using medications without difficulty Average home BPs 145/80's  No problems or lightheadedness No chest pain with exertion or shortness of breath No Edema   Hyperlipidemia Using medications without problems: Cramps at night Diet compliance: has been traveling and sitting and eating Exercise: walks on occasion  Pre-syncope Pt states he had 2 instances of feeling faint.  Resolved after kneeling down.    Relevant past medical, surgical, family and social history reviewed and updated as indicated. Interim medical history since our last visit reviewed. Allergies and medications reviewed and updated.  Review of Systems  Per HPI unless specifically indicated above     Objective:    BP (!) 148/86 (BP Location: Left Arm, Cuff Size: Normal)   Pulse 64   Temp 97.8 F (36.6 C)   Ht 5' 10.7" (1.796 m)   Wt 189 lb 12.8 oz (86.1 kg)   SpO2 97%   BMI 26.70 kg/m   Wt Readings from Last 3 Encounters:  01/16/17 189 lb 12.8 oz (86.1 kg)  07/18/16 183 lb 3.2 oz (83.1 kg)  01/12/16 176 lb 9.6 oz (80.1 kg)    Physical Exam  Constitutional: He is oriented to person, place, and time. He appears well-developed and well-nourished. No distress.  HENT:  Head: Normocephalic and atraumatic.  Eyes: Conjunctivae and lids are normal. Right eye exhibits no discharge. Left eye exhibits no discharge. No scleral icterus.  Neck: Normal range of motion. Neck supple. No JVD present. Carotid bruit is not present.  Cardiovascular: Normal rate, regular rhythm and normal heart sounds.   Pulmonary/Chest:  Effort normal and breath sounds normal. No respiratory distress.  Abdominal: Normal appearance. There is no splenomegaly or hepatomegaly.  Musculoskeletal: Normal range of motion.  Neurological: He is alert and oriented to person, place, and time.  Skin: Skin is warm, dry and intact. No rash noted. No pallor.  Psychiatric: He has a normal mood and affect. His behavior is normal. Judgment and thought content normal.    Results for orders placed or performed in visit on 07/18/16  Comprehensive metabolic panel  Result Value Ref Range   Glucose 98 65 - 99 mg/dL   BUN 14 8 - 27 mg/dL   Creatinine, Ser 1.23 0.76 - 1.27 mg/dL   GFR calc non Af Amer 60 >59 mL/min/1.73   GFR calc Af Amer 69 >59 mL/min/1.73   BUN/Creatinine Ratio 11 10 - 24   Sodium 139 134 - 144 mmol/L   Potassium 4.3 3.5 - 5.2 mmol/L   Chloride 101 96 - 106 mmol/L   CO2 26 18 - 29 mmol/L   Calcium 9.2 8.6 - 10.2 mg/dL   Total Protein 6.2 6.0 - 8.5 g/dL   Albumin 3.9 3.6 - 4.8 g/dL   Globulin, Total 2.3 1.5 - 4.5 g/dL   Albumin/Globulin Ratio 1.7 1.2 - 2.2   Bilirubin Total 0.6 0.0 - 1.2 mg/dL   Alkaline Phosphatase 55 39 - 117 IU/L  AST 17 0 - 40 IU/L   ALT 17 0 - 44 IU/L  PSA  Result Value Ref Range   Prostate Specific Ag, Serum 0.1 0.0 - 4.0 ng/mL  Lipid Panel w/o Chol/HDL Ratio  Result Value Ref Range   Cholesterol, Total 174 100 - 199 mg/dL   Triglycerides 115 0 - 149 mg/dL   HDL 50 >39 mg/dL   VLDL Cholesterol Cal 23 5 - 40 mg/dL   LDL Calculated 101 (H) 0 - 99 mg/dL      Assessment & Plan:   Problem List Items Addressed This Visit      Unprioritized   Hyperlipidemia    Temporary trial off of Atorvastatin.        Relevant Medications   lisinopril-hydrochlorothiazide (PRINZIDE,ZESTORETIC) 10-12.5 MG tablet   Hypertension - Primary   Relevant Medications   lisinopril-hydrochlorothiazide (PRINZIDE,ZESTORETIC) 10-12.5 MG tablet   Other Relevant Orders   Comprehensive metabolic panel   Microalbumin,  Urine Waived    Other Visit Diagnoses    Muscle cramps at night       Start Magnesium supplement QHS.  Stop Atorvastatin for 2-4 weeks.  If cramps improve restart Atorvastatin for another trial       Follow up plan: Return in about 4 weeks (around 02/13/2017).

## 2017-01-17 ENCOUNTER — Encounter: Payer: Self-pay | Admitting: Unknown Physician Specialty

## 2017-01-17 LAB — MICROALBUMIN, URINE WAIVED
Creatinine, Urine Waived: 200 mg/dL (ref 10–300)
MICROALB, UR WAIVED: 10 mg/L (ref 0–19)
Microalb/Creat Ratio: <30 mg/g (ref <30)

## 2017-01-17 LAB — COMPREHENSIVE METABOLIC PANEL
A/G RATIO: 1.7 (ref 1.2–2.2)
ALBUMIN: 4 g/dL (ref 3.5–4.8)
ALK PHOS: 55 IU/L (ref 39–117)
ALT: 20 IU/L (ref 0–44)
AST: 21 IU/L (ref 0–40)
BILIRUBIN TOTAL: 0.3 mg/dL (ref 0.0–1.2)
BUN / CREAT RATIO: 14 (ref 10–24)
BUN: 14 mg/dL (ref 8–27)
CHLORIDE: 99 mmol/L (ref 96–106)
CO2: 25 mmol/L (ref 20–29)
Calcium: 9.3 mg/dL (ref 8.6–10.2)
Creatinine, Ser: 1.02 mg/dL (ref 0.76–1.27)
GFR calc Af Amer: 86 mL/min/{1.73_m2} (ref >59)
GFR calc non Af Amer: 74 mL/min/{1.73_m2} (ref >59)
GLUCOSE: 96 mg/dL (ref 65–99)
Globulin, Total: 2.4 g/dL (ref 1.5–4.5)
POTASSIUM: 4.3 mmol/L (ref 3.5–5.2)
Sodium: 140 mmol/L (ref 134–144)
Total Protein: 6.4 g/dL (ref 6.0–8.5)

## 2017-02-14 ENCOUNTER — Ambulatory Visit (INDEPENDENT_AMBULATORY_CARE_PROVIDER_SITE_OTHER): Payer: Medicare Other | Admitting: Unknown Physician Specialty

## 2017-02-14 ENCOUNTER — Encounter: Payer: Self-pay | Admitting: Unknown Physician Specialty

## 2017-02-14 DIAGNOSIS — I10 Essential (primary) hypertension: Secondary | ICD-10-CM | POA: Diagnosis not present

## 2017-02-14 DIAGNOSIS — E782 Mixed hyperlipidemia: Secondary | ICD-10-CM | POA: Diagnosis not present

## 2017-02-14 MED ORDER — PRAVASTATIN SODIUM 40 MG PO TABS: 40.0000 mg | ORAL_TABLET | Freq: Every day | ORAL | 1 refills | Status: DC

## 2017-02-14 NOTE — Assessment & Plan Note (Signed)
Stable, continue present medications.   

## 2017-02-14 NOTE — Progress Notes (Signed)
BP 132/84   Pulse 72   Temp 97.7 F (36.5 C)   Wt 184 lb (83.5 kg)   SpO2 100%   BMI 25.88 kg/m    Subjective:    Patient ID: Richard Henry, male    DOB: [birth year], 71 y.o.   MRN: [id number]  HPI: Richard Henry is a 71 y.o. male  Chief Complaint  Patient presents with  . Follow-up    4 week f/up after being off at atorvastatin for muscle cramps   Hypertension Using medications without difficulty.  Lisinopril seems to be working Average home BPs SBP around 120  No problems or lightheadedness No chest pain with exertion or shortness of breath No Edema  Hyperlipidemia We stopped Atorvastatin for 1 month. States his leg cramps have not gone away but they seem to be dramatically improved.   Using medications without problems:  Relevant past medical, surgical, family and social history reviewed and updated as indicated. Interim medical history since our last visit reviewed. Allergies and medications reviewed and updated.  Review of Systems  Per HPI unless specifically indicated above     Objective:    BP 132/84   Pulse 72   Temp 97.7 F (36.5 C)   Wt 184 lb (83.5 kg)   SpO2 100%   BMI 25.88 kg/m   Wt Readings from Last 3 Encounters:  02/14/17 184 lb (83.5 kg)  01/16/17 189 lb 12.8 oz (86.1 kg)  07/18/16 183 lb 3.2 oz (83.1 kg)    Physical Exam  Constitutional: He is oriented to person, place, and time. He appears well-developed and well-nourished. No distress.  HENT:  Head: Normocephalic and atraumatic.  Eyes: Conjunctivae and lids are normal. Right eye exhibits no discharge. Left eye exhibits no discharge. No scleral icterus.  Neck: Normal range of motion. Neck supple. No JVD present. Carotid bruit is not present.  Cardiovascular: Normal rate, regular rhythm and normal heart sounds.   Pulmonary/Chest: Effort normal and breath sounds normal. No respiratory distress.  Abdominal: Normal appearance. There is no splenomegaly or hepatomegaly.  Musculoskeletal:  Normal range of motion.  Neurological: He is alert and oriented to person, place, and time.  Skin: Skin is warm, dry and intact. No rash noted. No pallor.  Psychiatric: He has a normal mood and affect. His behavior is normal. Judgment and thought content normal.    Results for orders placed or performed in visit on 01/16/17  Comprehensive metabolic panel  Result Value Ref Range   Glucose 96 65 - 99 mg/dL   BUN 14 8 - 27 mg/dL   Creatinine, Ser 1.02 0.76 - 1.27 mg/dL   GFR calc non Af Amer 74 >59 mL/min/1.73   GFR calc Af Amer 86 >59 mL/min/1.73   BUN/Creatinine Ratio 14 10 - 24   Sodium 140 134 - 144 mmol/L   Potassium 4.3 3.5 - 5.2 mmol/L   Chloride 99 96 - 106 mmol/L   CO2 25 20 - 29 mmol/L   Calcium 9.3 8.6 - 10.2 mg/dL   Total Protein 6.4 6.0 - 8.5 g/dL   Albumin 4.0 3.5 - 4.8 g/dL   Globulin, Total 2.4 1.5 - 4.5 g/dL   Albumin/Globulin Ratio 1.7 1.2 - 2.2   Bilirubin Total 0.3 0.0 - 1.2 mg/dL   Alkaline Phosphatase 55 39 - 117 IU/L   AST 21 0 - 40 IU/L   ALT 20 0 - 44 IU/L  Microalbumin, Urine Waived  Result Value Ref Range   Microalb, Ur Waived 10  0 - 19 mg/L   Creatinine, Urine Waived 200 10 - 300 mg/dL   Microalb/Creat Ratio <30 <30 mg/g      Assessment & Plan:   Problem List Items Addressed This Visit      Unprioritized   Hyperlipidemia    Stop Atorvastan.  Start Pravastatin 40 mg .        Relevant Medications   pravastatin (PRAVACHOL) 40 MG tablet   Hypertension    Stable, continue present medications.        Relevant Medications   pravastatin (PRAVACHOL) 40 MG tablet       Follow up plan: Return in about 3 months (around 05/17/2017).

## 2017-02-14 NOTE — Assessment & Plan Note (Signed)
Stop Atorvastan.  Start Pravastatin 40 mg .

## 2017-02-17 ENCOUNTER — Telehealth: Payer: Self-pay | Admitting: Unknown Physician Specialty

## 2017-02-17 NOTE — Telephone Encounter (Signed)
Will f/u in 3 months.  Will need a referral to neurology

## 2017-02-17 NOTE — Telephone Encounter (Signed)
Wife is here with concerns about total body spasms that lasts 30 minutes or more.  States it happens multiple times a day at night.

## 2017-02-28 ENCOUNTER — Encounter: Payer: Self-pay | Admitting: *Deleted

## 2017-02-28 ENCOUNTER — Emergency Department
Admission: EM | Admit: 2017-02-28 | Discharge: 2017-02-28 | Disposition: A | Discharge status: Home / Self Care | Payer: Medicare Other | Attending: Emergency Medicine | Admitting: Emergency Medicine

## 2017-02-28 DIAGNOSIS — L723 Sebaceous cyst: Secondary | ICD-10-CM | POA: Diagnosis not present

## 2017-02-28 DIAGNOSIS — I10 Essential (primary) hypertension: Secondary | ICD-10-CM | POA: Insufficient documentation

## 2017-02-28 DIAGNOSIS — J449 Chronic obstructive pulmonary disease, unspecified: Secondary | ICD-10-CM | POA: Insufficient documentation

## 2017-02-28 DIAGNOSIS — R222 Localized swelling, mass and lump, trunk: Secondary | ICD-10-CM | POA: Diagnosis present

## 2017-02-28 DIAGNOSIS — Z79899 Other long term (current) drug therapy: Secondary | ICD-10-CM | POA: Insufficient documentation

## 2017-02-28 DIAGNOSIS — L02211 Cutaneous abscess of abdominal wall: Secondary | ICD-10-CM | POA: Insufficient documentation

## 2017-02-28 DIAGNOSIS — L0291 Cutaneous abscess, unspecified: Secondary | ICD-10-CM

## 2017-02-28 DIAGNOSIS — F17200 Nicotine dependence, unspecified, uncomplicated: Secondary | ICD-10-CM | POA: Insufficient documentation

## 2017-02-28 MED ORDER — LIDOCAINE HCL (PF) 1 % IJ SOLN
INTRAMUSCULAR | Status: DC
Start: 2017-02-28 — End: 2017-02-28
  Filled 2017-02-28: qty 5

## 2017-02-28 MED ORDER — MUPIROCIN 2 % EX OINT: TOPICAL_OINTMENT | CUTANEOUS | 0 refills | Status: DC

## 2017-02-28 MED ORDER — CEPHALEXIN 250 MG PO CAPS: 250.0000 mg | ORAL_CAPSULE | Freq: Four times a day (QID) | ORAL | 0 refills | Status: AC

## 2017-02-28 MED ORDER — LIDOCAINE-EPINEPHRINE (PF) 1 %-1:200000 IJ SOLN
30.0000 mL | Freq: Once | INTRAMUSCULAR | Status: DC
  Filled 2017-02-28: qty 30

## 2017-02-28 MED ORDER — OXYCODONE-ACETAMINOPHEN 5-325 MG PO TABS: 1.0000 | ORAL_TABLET | Freq: Four times a day (QID) | ORAL | 0 refills | Status: DC | PRN

## 2017-02-28 NOTE — ED Notes (Signed)
Dr Jimmye Norman at bedside doing I and D.  Pt tolerated well.

## 2017-02-28 NOTE — ED Triage Notes (Signed)
Sent here by [facility] , abscess to mid abd x4-6 weeks , with pus drainage x1 week.

## 2017-02-28 NOTE — ED Provider Notes (Signed)
[facility] Emergency Department Provider Note       Time seen: ----------------------------------------- 12:11 PM on 02/28/2017 -----------------------------------------     I have reviewed the triage vital signs and the nursing notes.   HISTORY   Chief Complaint Abscess    HPI Richard Henry is a 71 y.o. male who presents to the ED for a skin abscess. Patient was sent here by [facility] and has noted an abscess to his abdominal wall for the past for 6 weeks. He said. The drainage over the last week. He denies fevers, chills or other complaints. Pain is 1 out of 10 in the abdominal wall.   Past Medical History:  Diagnosis Date  . Cancer [facility])    prostate  . Hyperlipidemia   . Hypertension   . Personal history of tobacco use, presenting hazards to health 08/06/2015  . Tobacco use disorder     Patient Active Problem List   Diagnosis Date Noted  . Advanced care planning/counseling discussion 07/18/2016  . Personal history of tobacco use, presenting hazards to health 08/06/2015  . Cancer of prostate ([place]) 05/06/2015  . Elevated prostate specific antigen (PSA) 05/06/2015  . COPD (chronic obstructive pulmonary disease) ([place]) 05/06/2015  . Tobacco use disorder 05/06/2015  . Hypertension 05/06/2015  . Hyperlipidemia 05/06/2015    Past Surgical History:  Procedure Laterality Date  . EYE SURGERY    . PROSTATE SURGERY  06/28/12  . RETINAL DETACHMENT SURGERY      Allergies Patient has no known allergies.  Social History Social History  Substance Use Topics  . Smoking status: Current Some Day Smoker    Packs/day: 0.00    Years: 35.00  . Smokeless tobacco: Never Used  . Alcohol use 1.2 oz/week    2 Cans of beer per week    Review of Systems Constitutional: Negative for fever. Musculoskeletal: Negative for back pain. Skin: positive for skin abscess Neurological: Negative for headaches, focal weakness or numbness.  All systems  negative/normal/unremarkable except as stated in the HPI  ____________________________________________   PHYSICAL EXAM:  VITAL SIGNS: ED Triage Vitals [02/28/17 1136]  Enc Vitals Group     BP 139/90     Pulse Rate 66     Resp 18     Temp 98.4 F (36.9 C)     Temp Source Oral     SpO2 100 %     Weight 184 lb (83.5 kg)     Height 5' 10.75" (1.797 m)     Head Circumference      Peak Flow      Pain Score 1     Pain Loc      Pain Edu?      Excl. in [place]?     Constitutional: Alert and oriented. Well appearing and in no distress. Eyes: Conjunctivae are normal. Normal extraocular movements. Gastrointestinal: firm area of induration and erythema approximately 5 cm in the upper mid abdomen. Musculoskeletal: Nontender with normal range of motion in extremities. No lower extremity tenderness nor edema. Neurologic:  Normal speech and language. No gross focal neurologic deficits are appreciated.  Skin:  small skin abscesses noted as dictated above ___________________________________________  ED COURSE:  Pertinent labs & imaging results that were available during my care of the patient were reviewed by me and considered in my medical decision making (see chart for details). Patient presents for an abscess that will need incision and drainage, we will incise the lesion and prescribe antibiotics.   Marland Kitchen.Incision and Drainage  Date/Time: 02/28/2017 1:04 PM Performed by: Earleen Newport Authorized by: Lenise Arena E   Consent:    Consent obtained:  Verbal   Consent given by:  Patient Location:    Type:  Abscess   Size:  5cm   Location:  Trunk   Trunk location:  Abdomen Pre-procedure details:    Skin preparation:  Betadine Anesthesia (see MAR for exact dosages):    Anesthesia method:  Local infiltration   Local anesthetic:  Lidocaine 1% WITH epi Procedure type:    Complexity:  Complex Procedure details:    Needle aspiration: no     Incision types:  Single straight    Incision depth:  Subcutaneous   Scalpel blade:  11   Wound management:  Probed and deloculated and irrigated with saline   Drainage:  Purulent   Drainage amount:  Moderate   Wound treatment:  Drain placed   Packing materials:  1/4 in gauze Post-procedure details:    Patient tolerance of procedure:  Tolerated well, no immediate complications Comments:     patient underwent incision and drainage of the abscess with also sebaceous material located in the lesion.   ____________________________________________  FINAL ASSESSMENT AND PLAN  skin abscess, sebaceous cyst   Plan: Patient had presented for an abscess and has undergone incision and drainage with good results. We have left packing in the wound and he can remove this tomorrow. He is stable for outpatient follow-up.   Earleen Newport, MD   Note: This note was generated in part or whole with voice recognition software. Voice recognition is usually quite accurate but there are transcription errors that can and very often do occur. I apologize for any typographical errors that were not detected and corrected.     Earleen Newport, MD 02/28/17 [id number]

## 2017-02-28 NOTE — ED Notes (Signed)
Pt to ed with c/o abscess to upper abd, red, approx 3 inches across.  Pt reports it has been there for 3 weeks.  Scant amount of greenish drainage noted.

## 2017-03-14 ENCOUNTER — Other Ambulatory Visit: Payer: Self-pay

## 2017-03-14 ENCOUNTER — Other Ambulatory Visit: Payer: Self-pay | Admitting: Unknown Physician Specialty

## 2017-03-14 DIAGNOSIS — I10 Essential (primary) hypertension: Secondary | ICD-10-CM

## 2017-03-14 MED ORDER — PRAVASTATIN SODIUM 40 MG PO TABS: 40.0000 mg | ORAL_TABLET | Freq: Every day | ORAL | 1 refills | Status: DC

## 2017-05-17 ENCOUNTER — Encounter: Payer: Self-pay | Admitting: Unknown Physician Specialty

## 2017-05-17 ENCOUNTER — Ambulatory Visit (INDEPENDENT_AMBULATORY_CARE_PROVIDER_SITE_OTHER): Payer: Medicare Other | Admitting: Unknown Physician Specialty

## 2017-05-17 VITALS — BP 138/81 | HR 69 | Temp 97.7°F | Wt 183.0 lb

## 2017-05-17 DIAGNOSIS — Z23 Encounter for immunization: Secondary | ICD-10-CM | POA: Diagnosis not present

## 2017-05-17 DIAGNOSIS — I10 Essential (primary) hypertension: Secondary | ICD-10-CM | POA: Diagnosis not present

## 2017-05-17 DIAGNOSIS — R252 Cramp and spasm: Secondary | ICD-10-CM | POA: Diagnosis not present

## 2017-05-17 DIAGNOSIS — E782 Mixed hyperlipidemia: Secondary | ICD-10-CM | POA: Diagnosis not present

## 2017-05-17 LAB — LIPID PANEL PICCOLO, WAIVED
CHOL/HDL RATIO PICCOLO,WAIVE: 2.7 mg/dL
Cholesterol Piccolo, Waived: 187 mg/dL (ref <200)
HDL Chol Piccolo, Waived: 68 mg/dL (ref >59)
LDL CHOL CALC PICCOLO WAIVED: 104 mg/dL — AB (ref <100)
Triglycerides Piccolo,Waived: 75 mg/dL (ref <150)
VLDL Chol Calc Piccolo,Waive: 15 mg/dL (ref <30)

## 2017-05-17 MED ORDER — PRAVASTATIN SODIUM 40 MG PO TABS: 40.0000 mg | ORAL_TABLET | Freq: Every day | ORAL | 1 refills | Status: DC

## 2017-05-17 NOTE — Assessment & Plan Note (Signed)
Tolerating Pravastatin better.  LDL is 104, same as on Atorvastatin.  Refill Pravastatin

## 2017-05-17 NOTE — Assessment & Plan Note (Addendum)
Not resolved.  Discussed Magnesium supplements for prevention.  Mustard and Vinegar for immediate relief

## 2017-05-17 NOTE — Assessment & Plan Note (Signed)
Stable, continue present medications.   

## 2017-05-17 NOTE — Progress Notes (Signed)
BP 138/81   Pulse 69   Temp 97.7 F (36.5 C) (Oral)   Wt 183 lb (83 kg)   SpO2 98%   BMI 25.70 kg/m    Subjective:    Patient ID: Richard Henry, male    DOB: Oct 26, 1945, 72 y.o.   MRN: [id number]  HPI: Richard Henry is a 71 y.o. male  Chief Complaint  Patient presents with  . Hyperlipidemia    pt states the pravastatin has been causing leg cramps as well   . Hypertension   Hypertension Using medications without difficulty Average home BPs - SBP 120's before pill   No problems or lightheadedness No chest pain with exertion or shortness of breath No Edema  Hyperlipidemia Last visit started Pravastatin as intolerant to Atorvastatin secondary to leg cramps.   Using medications without problems: Still having muscle cramps Diet compliance:Exercise:  Relevant past medical, surgical, family and social history reviewed and updated as indicated. Interim medical history since our last visit reviewed. Allergies and medications reviewed and updated.  Review of Systems  Constitutional: Negative.   Respiratory: Negative.   Cardiovascular: Negative.   Gastrointestinal: Negative.     Per HPI unless specifically indicated above     Objective:    BP 138/81   Pulse 69   Temp 97.7 F (36.5 C) (Oral)   Wt 183 lb (83 kg)   SpO2 98%   BMI 25.70 kg/m   Wt Readings from Last 3 Encounters:  05/17/17 183 lb (83 kg)  02/28/17 184 lb (83.5 kg)  02/14/17 184 lb (83.5 kg)    Physical Exam  Constitutional: He is oriented to person, place, and time. He appears well-developed and well-nourished. No distress.  HENT:  Head: Normocephalic and atraumatic.  Eyes: Conjunctivae and lids are normal. Right eye exhibits no discharge. Left eye exhibits no discharge. No scleral icterus.  Neck: Normal range of motion. Neck supple. No JVD present. Carotid bruit is not present.  Cardiovascular: Normal rate, regular rhythm and normal heart sounds.  Pulmonary/Chest: Effort normal and breath sounds  normal. No respiratory distress.  Abdominal: Normal appearance. There is no splenomegaly or hepatomegaly.  Musculoskeletal: Normal range of motion.  Neurological: He is alert and oriented to person, place, and time.  Skin: Skin is warm, dry and intact. No rash noted. No pallor.  Psychiatric: He has a normal mood and affect. His behavior is normal. Judgment and thought content normal.    Results for orders placed or performed in visit on 01/16/17  Comprehensive metabolic panel  Result Value Ref Range   Glucose 96 65 - 99 mg/dL   BUN 14 8 - 27 mg/dL   Creatinine, Ser 1.02 0.76 - 1.27 mg/dL   GFR calc non Af Amer 74 >59 mL/min/1.73   GFR calc Af Amer 86 >59 mL/min/1.73   BUN/Creatinine Ratio 14 10 - 24   Sodium 140 134 - 144 mmol/L   Potassium 4.3 3.5 - 5.2 mmol/L   Chloride 99 96 - 106 mmol/L   CO2 25 20 - 29 mmol/L   Calcium 9.3 8.6 - 10.2 mg/dL   Total Protein 6.4 6.0 - 8.5 g/dL   Albumin 4.0 3.5 - 4.8 g/dL   Globulin, Total 2.4 1.5 - 4.5 g/dL   Albumin/Globulin Ratio 1.7 1.2 - 2.2   Bilirubin Total 0.3 0.0 - 1.2 mg/dL   Alkaline Phosphatase 55 39 - 117 IU/L   AST 21 0 - 40 IU/L   ALT 20 0 - 44 IU/L  Microalbumin,  Urine Waived  Result Value Ref Range   Microalb, Ur Waived 10 0 - 19 mg/L   Creatinine, Urine Waived 200 10 - 300 mg/dL   Microalb/Creat Ratio <30 <30 mg/g      Assessment & Plan:   Problem List Items Addressed This Visit      Unprioritized   Hyperlipidemia    Tolerating Pravastatin better.  LDL is 104, same as on Atorvastatin.  Refill Pravastatin      Relevant Orders   Comprehensive metabolic panel   Lipid Panel [place], Waived   Hypertension    Stable, continue present medications.        Muscle cramps    Not resolved.  Discussed Magnesium supplements for prevention.  Mustard and Vinegar for immediate relief       Other Visit Diagnoses    Need for influenza vaccination    -  Primary   Relevant Orders   Flu vaccine HIGH DOSE PF (Completed)         Follow up plan: Return in about 6 months (around 11/14/2017).

## 2017-05-17 NOTE — Patient Instructions (Addendum)
Influenza (Flu) Vaccine (Inactivated or Recombinant): What You Need to Know 1. Why get vaccinated? Influenza ("flu") is a contagious disease that spreads around the [place] every year, usually between October and May. Flu is caused by influenza viruses, and is spread mainly by coughing, sneezing, and close contact. Anyone can get flu. Flu strikes suddenly and can last several days. Symptoms vary by age, but can include:  fever/chills  sore throat  muscle aches  fatigue  cough  headache  runny or stuffy nose  Flu can also lead to pneumonia and blood infections, and cause diarrhea and seizures in children. If you have a medical condition, such as heart or lung disease, flu can make it worse. Flu is more dangerous for some people. Infants and young children, people 23 years of age and older, pregnant women, and people with certain health conditions or a weakened immune system are at greatest risk. Each year thousands of people in the [place] die from flu, and many more are hospitalized. Flu vaccine can:  keep you from getting flu,  make flu less severe if you do get it, and  keep you from spreading flu to your family and other people. 2. Inactivated and recombinant flu vaccines A dose of flu vaccine is recommended every flu season. Children 6 months through 91 years of age may need two doses during the same flu season. Everyone else needs only one dose each flu season. Some inactivated flu vaccines contain a very small amount of a mercury-based preservative called thimerosal. Studies have not shown thimerosal in vaccines to be harmful, but flu vaccines that do not contain thimerosal are available. There is no live flu virus in flu shots. They cannot cause the flu. There are many flu viruses, and they are always changing. Each year a new flu vaccine is made to protect against three or four viruses that are likely to cause disease in the upcoming flu season. But even when the  vaccine doesn't exactly match these viruses, it may still provide some protection. Flu vaccine cannot prevent:  flu that is caused by a virus not covered by the vaccine, or  illnesses that look like flu but are not.  It takes about 2 weeks for protection to develop after vaccination, and protection lasts through the flu season. 3. Some people should not get this vaccine Tell the person who is giving you the vaccine:  If you have any severe, life-threatening allergies. If you ever had a life-threatening allergic reaction after a dose of flu vaccine, or have a severe allergy to any part of this vaccine, you may be advised not to get vaccinated. Most, but not all, types of flu vaccine contain a small amount of egg protein.  If you ever had Guillain-Barr Syndrome (also called GBS). Some people with a history of GBS should not get this vaccine. This should be discussed with your doctor.  If you are not feeling well. It is usually okay to get flu vaccine when you have a mild illness, but you might be asked to come back when you feel better.  4. Risks of a vaccine reaction With any medicine, including vaccines, there is a chance of reactions. These are usually mild and go away on their own, but serious reactions are also possible. Most people who get a flu shot do not have any problems with it. Minor problems following a flu shot include:  soreness, redness, or swelling where the shot was given  hoarseness  sore,  red or itchy eyes  cough  fever  aches  headache  itching  fatigue  If these problems occur, they usually begin soon after the shot and last 1 or 2 days. More serious problems following a flu shot can include the following:  There may be a small increased risk of Guillain-Barre Syndrome (GBS) after inactivated flu vaccine. This risk has been estimated at 1 or 2 additional cases per million people vaccinated. This is much lower than the risk of severe complications from  flu, which can be prevented by flu vaccine.  Young children who get the flu shot along with pneumococcal vaccine (PCV13) and/or DTaP vaccine at the same time might be slightly more likely to have a seizure caused by fever. Ask your doctor for more information. Tell your doctor if a child who is getting flu vaccine has ever had a seizure.  Problems that could happen after any injected vaccine:  People sometimes faint after a medical procedure, including vaccination. Sitting or lying down for about 15 minutes can help prevent fainting, and injuries caused by a fall. Tell your doctor if you feel dizzy, or have vision changes or ringing in the ears.  Some people get severe pain in the shoulder and have difficulty moving the arm where a shot was given. This happens very rarely.  Any medication can cause a severe allergic reaction. Such reactions from a vaccine are very rare, estimated at about 1 in a million doses, and would happen within a few minutes to a few hours after the vaccination. As with any medicine, there is a very remote chance of a vaccine causing a serious injury or death. The safety of vaccines is always being monitored. For more information, visit: http://www.aguilar.org/ 5. What if there is a serious reaction? What should I look for? Look for anything that concerns you, such as signs of a severe allergic reaction, very high fever, or unusual behavior. Signs of a severe allergic reaction can include hives, swelling of the face and throat, difficulty breathing, a fast heartbeat, dizziness, and weakness. These would start a few minutes to a few hours after the vaccination. What should I do?  If you think it is a severe allergic reaction or other emergency that can't wait, call 9-1-1 and get the person to the nearest hospital. Otherwise, call your doctor.  Reactions should be reported to the Vaccine Adverse Event Reporting System (VAERS). Your doctor should file this report, or you  can do it yourself through the VAERS web site at www.vaers.SamedayNews.es, or by calling 6094730752. ? VAERS does not give medical advice. 6. The National Vaccine Injury Compensation Program The [employer] Vaccine Injury Compensation Program (VICP) is a federal program that was created to compensate people who may have been injured by certain vaccines. Persons who believe they may have been injured by a vaccine can learn about the program and about filing a claim by calling 458-267-6070 or visiting the [place] website at GoldCloset.com.ee. There is a time limit to file a claim for compensation. 7. How can I learn more?  Ask your healthcare provider. He or she can give you the vaccine package insert or suggest other sources of information.  Call your local or state health department.  Contact the Centers for Disease Control and Prevention (CDC): ? Call (540)164-9661 (1-800-CDC-INFO) or ? Visit CDC's website at https://gibson.com/ Vaccine Information Statement, Inactivated Influenza Vaccine (01/24/2014) This information is not intended to replace advice given to you by your health care provider. Make sure  you discuss any questions you have with your health care provider. -------------------------------------------------- Magnesium supplement - I prefer Magnesium Citrate or Malate or Glycinate Tonic water Mustard/vinegar instant relief

## 2017-05-18 ENCOUNTER — Encounter: Payer: Self-pay | Admitting: Unknown Physician Specialty

## 2017-05-18 LAB — COMPREHENSIVE METABOLIC PANEL
ALBUMIN: 4.2 g/dL (ref 3.5–4.8)
ALT: 19 IU/L (ref 0–44)
AST: 29 IU/L (ref 0–40)
Albumin/Globulin Ratio: 1.7 (ref 1.2–2.2)
Alkaline Phosphatase: 40 IU/L (ref 39–117)
BILIRUBIN TOTAL: 0.3 mg/dL (ref 0.0–1.2)
BUN / CREAT RATIO: 13 (ref 10–24)
BUN: 14 mg/dL (ref 8–27)
CHLORIDE: 98 mmol/L (ref 96–106)
CO2: 30 mmol/L — ABNORMAL HIGH (ref 20–29)
Calcium: 10.7 mg/dL — ABNORMAL HIGH (ref 8.6–10.2)
Creatinine, Ser: 1.11 mg/dL (ref 0.76–1.27)
GFR calc non Af Amer: 67 mL/min/{1.73_m2} (ref >59)
GFR, EST AFRICAN AMERICAN: 77 mL/min/{1.73_m2} (ref >59)
GLUCOSE: 95 mg/dL (ref 65–99)
Globulin, Total: 2.5 g/dL (ref 1.5–4.5)
Potassium: 6 mmol/L — ABNORMAL HIGH (ref 3.5–5.2)
Sodium: 140 mmol/L (ref 134–144)
TOTAL PROTEIN: 6.7 g/dL (ref 6.0–8.5)

## 2017-07-19 ENCOUNTER — Encounter: Payer: Self-pay | Admitting: Family Medicine

## 2017-07-19 ENCOUNTER — Ambulatory Visit (INDEPENDENT_AMBULATORY_CARE_PROVIDER_SITE_OTHER): Payer: Medicare Other

## 2017-07-19 ENCOUNTER — Ambulatory Visit (INDEPENDENT_AMBULATORY_CARE_PROVIDER_SITE_OTHER): Payer: Medicare Other | Admitting: Family Medicine

## 2017-07-19 VITALS — BP 118/74 | HR 66 | Temp 97.8°F | Resp 97 | Ht 71.0 in | Wt 180.3 lb

## 2017-07-19 VITALS — BP 118/74 | HR 66 | Temp 97.8°F | Wt 180.3 lb

## 2017-07-19 DIAGNOSIS — Z1211 Encounter for screening for malignant neoplasm of colon: Secondary | ICD-10-CM | POA: Diagnosis not present

## 2017-07-19 DIAGNOSIS — J069 Acute upper respiratory infection, unspecified: Secondary | ICD-10-CM | POA: Diagnosis not present

## 2017-07-19 DIAGNOSIS — Z0000 Encounter for general adult medical examination without abnormal findings: Secondary | ICD-10-CM

## 2017-07-19 MED ORDER — AZITHROMYCIN 250 MG PO TABS: ORAL_TABLET | ORAL | 0 refills | Status: DC

## 2017-07-19 MED ORDER — PREDNISONE 10 MG PO TABS: ORAL_TABLET | ORAL | 0 refills | Status: DC

## 2017-07-19 NOTE — Patient Instructions (Signed)
Follow up as needed

## 2017-07-19 NOTE — Progress Notes (Signed)
Subjective:   Noris Kulinski is a 72 y.o. male who presents for Medicare Annual/Subsequent preventive examination.  Review of Systems:   Cardiac Risk Factors include: hypertension;advanced age (>22men, >30 women);male gender;dyslipidemia;smoking/ tobacco exposure     Objective:    Vitals: BP 118/74 (BP Location: Left Arm, Patient Position: Sitting)   Pulse 66   Temp 97.8 F (36.6 C) (Oral)   Resp (!) 97   Ht 5\' 11"  (1.803 m)   Wt 180 lb 4.8 oz (81.8 kg)   BMI 25.15 kg/m   Body mass index is 25.15 kg/m.  Advanced Directives 07/19/2017 02/28/2017 07/18/2016 07/14/2015 07/14/2015  Does Patient Have a Medical Advance Directive? Yes Yes Yes Yes Yes  Type of Paramedic of [place];Living will Living will;Healthcare Power of [place];Living will [place];Living will Living will;Healthcare Power of Attorney  Does patient want to make changes to medical advance directive? - - - No - Patient declined -  Copy of [place] in Chart? Yes No - copy requested No - copy requested Yes -    Tobacco Social History   Tobacco Use  Smoking Status Current Some Day Smoker  . Packs/day: 0.00  . Years: 35.00  . Pack years: 0.00  . Types: Cigarettes  Smokeless Tobacco Never Used     Ready to quit: Yes Counseling given: Yes   Clinical Intake:  Pre-visit preparation completed: Yes  Pain : No/denies pain     Nutritional Status: BMI 25 -29 Overweight Nutritional Risks: None Diabetes: No  How often do you need to have someone help you when you read instructions, pamphlets, or other written materials from your doctor or pharmacy?: 1 - Never What is the last grade level you completed in school?: some college   Interpreter Needed?: No  Information entered by :: TIffany Hill,LPN  Past Medical History:  Diagnosis Date  . Cancer [facility])    prostate  . Hyperlipidemia   . Hypertension   . Personal  history of tobacco use, presenting hazards to health 08/06/2015  . Tobacco use disorder    Past Surgical History:  Procedure Laterality Date  . EYE SURGERY    . PROSTATE SURGERY  06/28/12  . RETINAL DETACHMENT SURGERY     Family History  Problem Relation Age of Onset  . Hypertension Mother   . Osteoporosis Mother   . Cancer Father        lung  . Hypertension Father    Social History   Socioeconomic History  . Marital status: Married    Spouse name: None  . Number of children: None  . Years of education: None  . Highest education level: None  Social Needs  . Financial resource strain: Not hard at all  . Food insecurity - worry: Never true  . Food insecurity - inability: Never true  . Transportation needs - medical: No  . Transportation needs - non-medical: No  Occupational History  . None  Tobacco Use  . Smoking status: Current Some Day Smoker    Packs/day: 0.00    Years: 35.00    Pack years: 0.00    Types: Cigarettes  . Smokeless tobacco: Never Used  Substance and Sexual Activity  . Alcohol use: Yes    Alcohol/week: 1.2 oz    Types: 2 Cans of beer per week  . Drug use: No  . Sexual activity: Yes  Other Topics Concern  . None  Social History Narrative  .  None    Outpatient Encounter Medications as of 07/19/2017  Medication Sig  . lisinopril-hydrochlorothiazide (PRINZIDE,ZESTORETIC) 10-12.5 MG tablet TAKE 1 TABLET BY MOUTH  DAILY  . Multiple Vitamin (MULTIVITAMIN) tablet Take 1 tablet by mouth daily.  . pravastatin (PRAVACHOL) 40 MG tablet Take 1 tablet (40 mg total) by mouth daily.  . timolol (TIMOPTIC) 0.5 % ophthalmic solution Administer 1 drop to both eyes daily.    No facility-administered encounter medications on file as of 07/19/2017.     Activities of Daily Living In your present state of health, do you have any difficulty performing the following activities: 07/19/2017  Hearing? N  Vision? N  Difficulty concentrating or making decisions? N  Walking  or climbing stairs? N  Dressing or bathing? N  Doing errands, shopping? N  Preparing Food and eating ? N  Using the Toilet? N  In the past six months, have you accidently leaked urine? N  Do you have problems with loss of bowel control? N  Managing your Medications? N  Managing your Finances? N  Housekeeping or managing your Housekeeping? N  Some recent data might be hidden    Patient Care Team: Kathrine Haddock, NP as PCP - General (Nurse Practitioner) Kathrine Haddock, NP as PCP - Family Medicine (Nurse Practitioner) Estill Cotta, MD (Ophthalmology)   Assessment:   This is a routine wellness examination for [place].  Exercise Activities and Dietary recommendations Current Exercise Habits: The patient does not participate in regular exercise at present, Exercise limited by: respiratory conditions(s)  Goals    None      Fall Risk Fall Risk  07/19/2017 07/18/2016 07/14/2015  Falls in the past year? No No No   Is the patient's home free of loose throw rugs in walkways, pet beds, electrical cords, etc?   yes      Grab bars in the bathroom? no      Handrails on the stairs?   yes      Adequate lighting?   yes  Timed Get Up and Go Performed: Completed in 8 seconds with no use of assistive devices, steady gait. No intervention needed at this time.   Depression Screen PHQ 2/9 Scores 07/19/2017 07/18/2016 07/14/2015  PHQ - 2 Score 0 1 0  PHQ- 9 Score - 3 -    Cognitive Function     6CIT Screen 07/19/2017  What Year? 0 points  What month? 0 points  What time? 0 points  Count back from 20 0 points  Months in reverse 0 points  Repeat phrase 0 points  Total Score 0    Immunization History  Administered Date(s) Administered  . Influenza, High Dose Seasonal PF 05/17/2017  . Influenza,inj,Quad PF,6+ Mos 07/14/2015  . Pneumococcal Conjugate-13 02/03/2014  . Pneumococcal Polysaccharide-23 03/14/2012  . Tdap 01/25/2010  . Zoster 07/14/2015    Qualifies for Shingles Vaccine?  Up to date  Screening Tests Health Maintenance  Topic Date Due  . COLONOSCOPY  01/22/2017  . TETANUS/TDAP  01/26/2020  . INFLUENZA VACCINE  Completed  . Hepatitis C Screening  Completed  . PNA vac Low Risk Adult  Completed   Cancer Screenings: Lung: Low Dose CT Chest recommended if Age 51-80 years, 30 pack-year currently smoking OR have quit w/in 15years. Patient does qualify. Order in system expired Colorectal: due now- referral sent to GI   Additional Screenings: Hepatitis B/HIV/Syphillis: not indicated Hepatitis C Screening: completed 07/14/2015    Plan:    I have personally reviewed and addressed the Medicare  Annual Wellness questionnaire and have noted the following in the patient's chart:  A. Medical and social history B. Use of alcohol, tobacco or illicit drugs  C. Current medications and supplements D. Functional ability and status E.  Nutritional status F.  Physical activity G. Advance directives H. List of other physicians I.  Hospitalizations, surgeries, and ER visits in previous 12 months J.  [place] such as hearing and vision if needed, cognitive and depression L. Referrals and appointments   In addition, I have reviewed and discussed with patient certain preventive protocols, quality metrics, and best practice recommendations. A written personalized care plan for preventive services as well as general preventive health recommendations were provided to patient.   Signed,  Tyler Aas, LPN Nurse Health Advisor   Nurse Notes:none

## 2017-07-19 NOTE — Progress Notes (Signed)
BP 118/74 (BP Location: Left Arm, Patient Position: Sitting)   Pulse 66   Temp 97.8 F (36.6 C) (Temporal)   Wt 180 lb 4.8 oz (81.8 kg)   SpO2 97%   BMI 25.15 kg/m    Subjective:    Patient ID: Richard Henry, male    DOB: Apr 30, 1946, 72 y.o.   MRN: [id number]  HPI: Richard Henry is a 72 y.o. male  Chief Complaint  Patient presents with  . Nasal Congestion    x1 month  . Cough   About a month of productive, hacking cough that is worst at night when laying down. Sometimes gets choked on all the phegm. Having some SOB but no wheezing. Been taking mucinex x 1 month with no relief there. Denies fevers, chills, body aches, CP. Traveling across the country in a few days.   Relevant past medical, surgical, family and social history reviewed and updated as indicated. Interim medical history since our last visit reviewed. Allergies and medications reviewed and updated.  Review of Systems  Constitutional: Negative.   HENT: Positive for congestion.   Respiratory: Positive for cough, chest tightness and shortness of breath.   Cardiovascular: Negative.   Gastrointestinal: Negative.   Genitourinary: Negative.   Musculoskeletal: Negative.   Neurological: Negative.   Psychiatric/Behavioral: Negative.    Per HPI unless specifically indicated above     Objective:    BP 118/74 (BP Location: Left Arm, Patient Position: Sitting)   Pulse 66   Temp 97.8 F (36.6 C) (Temporal)   Wt 180 lb 4.8 oz (81.8 kg)   SpO2 97%   BMI 25.15 kg/m   Wt Readings from Last 3 Encounters:  07/19/17 180 lb 4.8 oz (81.8 kg)  07/19/17 180 lb 4.8 oz (81.8 kg)  05/17/17 183 lb (83 kg)    Physical Exam  Constitutional: He is oriented to person, place, and time. He appears well-developed and well-nourished. No distress.  HENT:  Head: Atraumatic.  Right Ear: External ear normal.  Left Ear: External ear normal.  Nose: Nose normal.  Mouth/Throat: No oropharyngeal exudate.  Oropharynx erythematous posteriorly   Eyes: Conjunctivae are normal. No scleral icterus.  Neck: Normal range of motion. Neck supple.  Cardiovascular: Normal rate and normal heart sounds.  Pulmonary/Chest: Effort normal. No respiratory distress. He has wheezes (minimal).  Musculoskeletal: Normal range of motion.  Neurological: He is alert and oriented to person, place, and time.  Skin: Skin is warm and dry.  Psychiatric: He has a normal mood and affect. His behavior is normal.  Nursing note and vitals reviewed.   Results for orders placed or performed in visit on 05/17/17  Comprehensive metabolic panel  Result Value Ref Range   Glucose 95 65 - 99 mg/dL   BUN 14 8 - 27 mg/dL   Creatinine, Ser 1.11 0.76 - 1.27 mg/dL   GFR calc non Af Amer 67 >59 mL/min/1.73   GFR calc Af Amer 77 >59 mL/min/1.73   BUN/Creatinine Ratio 13 10 - 24   Sodium 140 134 - 144 mmol/L   Potassium 6.0 (H) 3.5 - 5.2 mmol/L   Chloride 98 96 - 106 mmol/L   CO2 30 (H) 20 - 29 mmol/L   Calcium 10.7 (H) 8.6 - 10.2 mg/dL   Total Protein 6.7 6.0 - 8.5 g/dL   Albumin 4.2 3.5 - 4.8 g/dL   Globulin, Total 2.5 1.5 - 4.5 g/dL   Albumin/Globulin Ratio 1.7 1.2 - 2.2   Bilirubin Total 0.3 0.0 - 1.2 mg/dL  Alkaline Phosphatase 40 39 - 117 IU/L   AST 29 0 - 40 IU/L   ALT 19 0 - 44 IU/L  Lipid Panel Piccolo, Waived  Result Value Ref Range   Cholesterol Piccolo, Waived 187 <200 mg/dL   HDL Chol Piccolo, Waived 68 >59 mg/dL   Triglycerides Piccolo,Waived 75 <150 mg/dL   Chol/HDL Ratio Piccolo,Waive 2.7 mg/dL   LDL Chol Calc Piccolo Waived 104 (H) <100 mg/dL   VLDL Chol Calc Piccolo,Waive 15 <30 mg/dL      Assessment & Plan:   Problem List Items Addressed This Visit    None    Visit Diagnoses    Upper respiratory tract infection, unspecified type    -  Primary   Will tx with zpack and prednisone taper. Continue plain mucinex, supportive care reviewed with humidifier, vapor rubs, propping up at night. F/u if no better   Relevant Medications    azithromycin (ZITHROMAX) 250 MG tablet       Follow up plan: Return for as scheduled.

## 2017-07-19 NOTE — Patient Instructions (Addendum)
[name] , Thank you for taking time to come for your Medicare Wellness Visit. I appreciate your ongoing commitment to your health goals. Please review the following plan we discussed and let me know if I can assist you in the future.   Screening recommendations/referrals: Colonoscopy: due now- referral ordered for GI Recommended yearly ophthalmology/optometry visit for glaucoma screening and checkup Recommended yearly dental visit for hygiene and checkup  Vaccinations: Influenza vaccine: up to date Pneumococcal vaccine: up to date Tdap vaccine: up to date Shingles vaccine: up to date  Advanced directives: copy on file  Conditions/risks identified: smoking cessation discussed  Next appointment: Follow up in one year for your annual wellness exam.   Preventive Care 71 Years and Older, Male Preventive care refers to lifestyle choices and visits with your health care provider that can promote health and wellness. What does preventive care include?  A yearly physical exam. This is also called an annual well check.  Dental exams once or twice a year.  Routine eye exams. Ask your health care provider how often you should have your eyes checked.  Personal lifestyle choices, including:  Daily care of your teeth and gums.  Regular physical activity.  Eating a healthy diet.  Avoiding tobacco and drug use.  Limiting alcohol use.  Practicing safe sex.  Taking low doses of aspirin every day.  Taking vitamin and mineral supplements as recommended by your health care provider. What happens during an annual well check? The services and screenings done by your health care provider during your annual well check will depend on your age, overall health, lifestyle risk factors, and family history of disease. Counseling  Your health care provider may ask you questions about your:  Alcohol use.  Tobacco use.  Drug use.  Emotional well-being.  Home and relationship  well-being.  Sexual activity.  Eating habits.  History of falls.  Memory and ability to understand (cognition).  Work and work Statistician. Screening  You may have the following tests or measurements:  Height, weight, and BMI.  Blood pressure.  Lipid and cholesterol levels. These may be checked every 5 years, or more frequently if you are over 88 years old.  Skin check.  Lung cancer screening. You may have this screening every year starting at age 26 if you have a 30-pack-year history of smoking and currently smoke or have quit within the past 15 years.  Fecal occult blood test (FOBT) of the stool. You may have this test every year starting at age 53.  Flexible sigmoidoscopy or colonoscopy. You may have a sigmoidoscopy every 5 years or a colonoscopy every 10 years starting at age 39.  Prostate cancer screening. Recommendations will vary depending on your family history and other risks.  Hepatitis C blood test.  Hepatitis B blood test.  Sexually transmitted disease (STD) testing.  Diabetes screening. This is done by checking your blood sugar (glucose) after you have not eaten for a while (fasting). You may have this done every 1-3 years.  Abdominal aortic aneurysm (AAA) screening. You may need this if you are a current or former smoker.  Osteoporosis. You may be screened starting at age 5 if you are at high risk. Talk with your health care provider about your test results, treatment options, and if necessary, the need for more tests. Vaccines  Your health care provider may recommend certain vaccines, such as:  Influenza vaccine. This is recommended every year.  Tetanus, diphtheria, and acellular pertussis (Tdap, Td) vaccine. You may  need a Td booster every 10 years.  Zoster vaccine. You may need this after age 76.  Pneumococcal 13-valent conjugate (PCV13) vaccine. One dose is recommended after age 76.  Pneumococcal polysaccharide (PPSV23) vaccine. One dose is  recommended after age 17. Talk to your health care provider about which screenings and vaccines you need and how often you need them. This information is not intended to replace advice given to you by your health care provider. Make sure you discuss any questions you have with your health care provider. Document Released: 07/03/2015 Document Revised: 02/24/2016 Document Reviewed: 04/07/2015 Elsevier Interactive Patient Education  2017 [place] Prevention in the Home Falls can cause injuries. They can happen to people of all ages. There are many things you can do to make your home safe and to help prevent falls. What can I do on the outside of my home?  Regularly fix the edges of walkways and driveways and fix any cracks.  Remove anything that might make you trip as you walk through a door, such as a raised step or threshold.  Trim any bushes or trees on the path to your home.  Use bright outdoor lighting.  Clear any walking paths of anything that might make someone trip, such as rocks or tools.  Regularly check to see if handrails are loose or broken. Make sure that both sides of any steps have handrails.  Any raised decks and porches should have guardrails on the edges.  Have any leaves, snow, or ice cleared regularly.  Use sand or salt on walking paths during winter.  Clean up any spills in your garage right away. This includes oil or grease spills. What can I do in the bathroom?  Use night lights.  Install grab bars by the toilet and in the tub and shower. Do not use towel bars as grab bars.  Use non-skid mats or decals in the tub or shower.  If you need to sit down in the shower, use a plastic, non-slip stool.  Keep the floor dry. Clean up any water that spills on the floor as soon as it happens.  Remove soap buildup in the tub or shower regularly.  Attach bath mats securely with double-sided non-slip rug tape.  Do not have throw rugs and other things on  the floor that can make you trip. What can I do in the bedroom?  Use night lights.  Make sure that you have a light by your bed that is easy to reach.  Do not use any sheets or blankets that are too big for your bed. They should not hang down onto the floor.  Have a firm chair that has side arms. You can use this for support while you get dressed.  Do not have throw rugs and other things on the floor that can make you trip. What can I do in the kitchen?  Clean up any spills right away.  Avoid walking on wet floors.  Keep items that you use a lot in easy-to-reach places.  If you need to reach something above you, use a strong step stool that has a grab bar.  Keep electrical cords out of the way.  Do not use floor polish or wax that makes floors slippery. If you must use wax, use non-skid floor wax.  Do not have throw rugs and other things on the floor that can make you trip. What can I do with my stairs?  Do not leave any items on  the stairs.  Make sure that there are handrails on both sides of the stairs and use them. Fix handrails that are broken or loose. Make sure that handrails are as long as the stairways.  Check any carpeting to make sure that it is firmly attached to the stairs. Fix any carpet that is loose or worn.  Avoid having throw rugs at the top or bottom of the stairs. If you do have throw rugs, attach them to the floor with carpet tape.  Make sure that you have a light switch at the top of the stairs and the bottom of the stairs. If you do not have them, ask someone to add them for you. What else can I do to help prevent falls?  Wear shoes that:  Do not have high heels.  Have rubber bottoms.  Are comfortable and fit you well.  Are closed at the toe. Do not wear sandals.  If you use a stepladder:  Make sure that it is fully opened. Do not climb a closed stepladder.  Make sure that both sides of the stepladder are locked into place.  Ask someone to  hold it for you, if possible.  Clearly mark and make sure that you can see:  Any grab bars or handrails.  First and last steps.  Where the edge of each step is.  Use tools that help you move around (mobility aids) if they are needed. These include:  Canes.  Walkers.  Scooters.  Crutches.  Turn on the lights when you go into a dark area. Replace any light bulbs as soon as they burn out.  Set up your furniture so you have a clear path. Avoid moving your furniture around.  If any of your floors are uneven, fix them.  If there are any pets around you, be aware of where they are.  Review your medicines with your doctor. Some medicines can make you feel dizzy. This can increase your chance of falling. Ask your doctor what other things that you can do to help prevent falls. This information is not intended to replace advice given to you by your health care provider. Make sure you discuss any questions you have with your health care provider. Document Released: 04/02/2009 Document Revised: 11/12/2015 Document Reviewed: 07/11/2014 Elsevier Interactive Patient Education  2017 [employer].  Steps to Quit Smoking Smoking tobacco can be bad for your health. It can also affect almost every organ in your body. Smoking puts you and people around you at risk for many serious long-lasting (chronic) diseases. Quitting smoking is hard, but it is one of the best things that you can do for your health. It is never too late to quit. What are the benefits of quitting smoking? When you quit smoking, you lower your risk for getting serious diseases and conditions. They can include:  Lung cancer or lung disease.  Heart disease.  Stroke.  Heart attack.  Not being able to have children (infertility).  Weak bones (osteoporosis) and broken bones (fractures).  If you have coughing, wheezing, and shortness of breath, those symptoms may get better when you quit. You may also get sick less often.  If you are pregnant, quitting smoking can help to lower your chances of having a baby of low birth weight. What can I do to help me quit smoking? Talk with your doctor about what can help you quit smoking. Some things you can do (strategies) include:  Quitting smoking totally, instead of slowly cutting back how  much you smoke over a period of time.  Going to in-person counseling. You are more likely to quit if you go to many counseling sessions.  Using resources and support systems, such as: ? Database administrator with a Social worker. ? Phone quitlines. ? Careers information officer. ? Support groups or group counseling. ? Text messaging programs. ? Mobile phone apps or applications.  Taking medicines. Some of these medicines may have nicotine in them. If you are pregnant or breastfeeding, do not take any medicines to quit smoking unless your doctor says it is okay. Talk with your doctor about counseling or other things that can help you.  Talk with your doctor about using more than one strategy at the same time, such as taking medicines while you are also going to in-person counseling. This can help make quitting easier. What things can I do to make it easier to quit? Quitting smoking might feel very hard at first, but there is a lot that you can do to make it easier. Take these steps:  Talk to your family and friends. Ask them to support and encourage you.  Call phone quitlines, reach out to support groups, or work with a Social worker.  Ask people who smoke to not smoke around you.  Avoid places that make you want (trigger) to smoke, such as: ? Bars. ? Parties. ? Smoke-break areas at work.  Spend time with people who do not smoke.  Lower the stress in your life. Stress can make you want to smoke. Try these things to help your stress: ? Getting regular exercise. ? Deep-breathing exercises. ? Yoga. ? Meditating. ? Doing a body scan. To do this, close your eyes, focus on one area of your  body at a time from head to toe, and notice which parts of your body are tense. Try to relax the muscles in those areas.  Download or buy apps on your mobile phone or tablet that can help you stick to your quit plan. There are many free apps, such as QuitGuide from the [employer] Office manager for Disease Control and Prevention). You can find more support from smokefree.gov and other websites.  This information is not intended to replace advice given to you by your health care provider. Make sure you discuss any questions you have with your health care provider. Document Released: 04/02/2009 Document Revised: 02/02/2016 Document Reviewed: 10/21/2014 Elsevier Interactive Patient Education  2018 [employer].

## 2017-07-21 ENCOUNTER — Other Ambulatory Visit: Payer: Self-pay

## 2017-07-21 DIAGNOSIS — Z1211 Encounter for screening for malignant neoplasm of colon: Secondary | ICD-10-CM

## 2017-08-30 ENCOUNTER — Telehealth: Payer: Self-pay | Admitting: *Deleted

## 2017-08-30 DIAGNOSIS — Z122 Encounter for screening for malignant neoplasm of respiratory organs: Secondary | ICD-10-CM

## 2017-08-30 DIAGNOSIS — Z87891 Personal history of nicotine dependence: Secondary | ICD-10-CM

## 2017-08-30 NOTE — Telephone Encounter (Signed)
Received referral for initial lung cancer screening scan. Contacted patient and obtained smoking history,(current, 36 pack year) as well as answering questions related to screening process. Patient denies signs of lung cancer such as weight loss or hemoptysis. Patient denies comorbidity that would prevent curative treatment if lung cancer were found. Shared Decision Making Visit was done 08/07/15 but this will be the initial lung screening CT scan.

## 2017-09-05 ENCOUNTER — Encounter
Admission: RE | Disposition: A | Discharge status: Home / Self Care | Payer: Self-pay | Source: Ambulatory Visit | Attending: Gastroenterology

## 2017-09-05 ENCOUNTER — Ambulatory Visit
Admission: RE | Admit: 2017-09-05 | Discharge: 2017-09-05 | Disposition: A | Discharge status: Home / Self Care | Payer: Medicare Other | Source: Ambulatory Visit | Attending: Gastroenterology | Admitting: Gastroenterology

## 2017-09-05 ENCOUNTER — Encounter: Payer: Self-pay | Admitting: *Deleted

## 2017-09-05 ENCOUNTER — Ambulatory Visit: Payer: Medicare Other | Admitting: Anesthesiology

## 2017-09-05 DIAGNOSIS — K5730 Diverticulosis of large intestine without perforation or abscess without bleeding: Secondary | ICD-10-CM | POA: Insufficient documentation

## 2017-09-05 DIAGNOSIS — I10 Essential (primary) hypertension: Secondary | ICD-10-CM | POA: Diagnosis not present

## 2017-09-05 DIAGNOSIS — Z1211 Encounter for screening for malignant neoplasm of colon: Secondary | ICD-10-CM | POA: Diagnosis not present

## 2017-09-05 DIAGNOSIS — F17210 Nicotine dependence, cigarettes, uncomplicated: Secondary | ICD-10-CM | POA: Insufficient documentation

## 2017-09-05 DIAGNOSIS — D125 Benign neoplasm of sigmoid colon: Secondary | ICD-10-CM

## 2017-09-05 DIAGNOSIS — D124 Benign neoplasm of descending colon: Secondary | ICD-10-CM

## 2017-09-05 DIAGNOSIS — Z79899 Other long term (current) drug therapy: Secondary | ICD-10-CM | POA: Insufficient documentation

## 2017-09-05 DIAGNOSIS — J449 Chronic obstructive pulmonary disease, unspecified: Secondary | ICD-10-CM | POA: Diagnosis not present

## 2017-09-05 DIAGNOSIS — K635 Polyp of colon: Secondary | ICD-10-CM | POA: Diagnosis not present

## 2017-09-05 HISTORY — DX: Unspecified glaucoma: H40.9

## 2017-09-05 HISTORY — PX: COLONOSCOPY WITH PROPOFOL: SHX5780

## 2017-09-05 SURGERY — COLONOSCOPY WITH PROPOFOL
Anesthesia: Choice

## 2017-09-05 SURGERY — COLONOSCOPY WITH PROPOFOL
Anesthesia: General

## 2017-09-05 MED ORDER — GLYCOPYRROLATE 0.2 MG/ML IJ SOLN
INTRAMUSCULAR | Status: DC | PRN
  Administered 2017-09-05: 0.1 mg via INTRAVENOUS

## 2017-09-05 MED ORDER — SODIUM CHLORIDE 0.9 % IV SOLN
INTRAVENOUS | Status: DC
  Administered 2017-09-05: 1000 mL via INTRAVENOUS

## 2017-09-05 MED ORDER — PROPOFOL 500 MG/50ML IV EMUL
INTRAVENOUS | Status: DC | PRN
  Administered 2017-09-05: 140 ug/kg/min via INTRAVENOUS

## 2017-09-05 MED ORDER — GLYCOPYRROLATE 0.2 MG/ML IJ SOLN
INTRAMUSCULAR | Status: AC
  Filled 2017-09-05: qty 1

## 2017-09-05 MED ORDER — PROPOFOL 10 MG/ML IV BOLUS
INTRAVENOUS | Status: DC | PRN
  Administered 2017-09-05: 30 mg via INTRAVENOUS
  Administered 2017-09-05: 70 mg via INTRAVENOUS

## 2017-09-05 NOTE — Anesthesia Postprocedure Evaluation (Signed)
Anesthesia Post Note  Patient: Richard Henry  Procedure(s) Performed: COLONOSCOPY WITH PROPOFOL (N/A )  Patient location during evaluation: Endoscopy Anesthesia Type: General Level of consciousness: awake and alert Pain management: pain level controlled Vital Signs Assessment: post-procedure vital signs reviewed and stable Respiratory status: spontaneous breathing, nonlabored ventilation, respiratory function stable and patient connected to nasal cannula oxygen Cardiovascular status: blood pressure returned to baseline and stable Postop Assessment: no apparent nausea or vomiting Anesthetic complications: no     Last Vitals:  Vitals:   09/05/17 0920 09/05/17 0930  BP: (!) 141/73 (!) 147/77  Pulse: 81 80  Resp: 18 (!) 25  Temp:    SpO2: 100% 100%    Last Pain:  Vitals:   09/05/17 0910  TempSrc: Tympanic                 Towanna Avery S

## 2017-09-05 NOTE — H&P (Signed)
Lucilla Lame, MD [facility] [address] [place], [place] [address] Phone: [phone] Fax : [phone]  Primary Care Physician:  Kathrine Haddock, NP Primary Gastroenterologist:  Dr. Allen Norris  Pre-Procedure History & Physical: HPI:  Richard Henry is a 72 y.o. male is here for a screening colonoscopy.   Past Medical History:  Diagnosis Date  . Cancer [facility])    prostate  . Glaucoma   . Hyperlipidemia   . Hypertension   . Personal history of tobacco use, presenting hazards to health 08/06/2015  . Tobacco use disorder     Past Surgical History:  Procedure Laterality Date  . EYE SURGERY    . PROSTATE SURGERY  06/28/12  . RETINAL DETACHMENT SURGERY      Prior to Admission medications   Medication Sig Start Date End Date Taking? Authorizing Provider  timolol (TIMOPTIC) 0.5 % ophthalmic solution Administer 1 drop to both eyes daily.  07/04/13  Yes [provider]  lisinopril-hydrochlorothiazide (PRINZIDE,ZESTORETIC) 10-12.5 MG tablet TAKE 1 TABLET BY MOUTH  DAILY 03/14/17   Kathrine Haddock, NP  Multiple Vitamin (MULTIVITAMIN) tablet Take 1 tablet by mouth daily.    [provider]  pravastatin (PRAVACHOL) 40 MG tablet Take 1 tablet (40 mg total) by mouth daily. 05/17/17   Kathrine Haddock, NP    Allergies as of 07/21/2017  . (No Known Allergies)    Family History  Problem Relation Age of Onset  . Hypertension Mother   . Osteoporosis Mother   . Cancer Father        lung  . Hypertension Father     Social History   Socioeconomic History  . Marital status: Married    Spouse name: Not on file  . Number of children: Not on file  . Years of education: Not on file  . Highest education level: Not on file  Social Needs  . Financial resource strain: Not hard at all  . Food insecurity - worry: Never true  . Food insecurity - inability: Never true  . Transportation needs - medical: No  . Transportation needs - non-medical: No  Occupational History  . Not on  file  Tobacco Use  . Smoking status: Current Some Day Smoker    Packs/day: 0.00    Years: 35.00    Pack years: 0.00    Types: Cigarettes  . Smokeless tobacco: Never Used  Substance and Sexual Activity  . Alcohol use: Yes    Alcohol/week: 1.2 oz    Types: 2 Cans of beer per week  . Drug use: No  . Sexual activity: Yes  Other Topics Concern  . Not on file  Social History Narrative  . Not on file    Review of Systems: See HPI, otherwise negative ROS  Physical Exam: BP 128/79   Pulse 77   Temp (!) 97.1 F (36.2 C) (Tympanic)   Resp 20   Ht 5\' 11"  (1.803 m)   Wt 174 lb (78.9 kg)   SpO2 100%   BMI 24.27 kg/m  General:   Alert,  pleasant and cooperative in NAD Head:  Normocephalic and atraumatic. Neck:  Supple; no masses or thyromegaly. Lungs:  Clear throughout to auscultation.    Heart:  Regular rate and rhythm. Abdomen:  Soft, nontender and nondistended. Normal bowel sounds, without guarding, and without rebound.   Neurologic:  Alert and  oriented x4;  grossly normal neurologically.  Impression/Plan: Richard Henry is now here to undergo a screening colonoscopy.  Risks, benefits, and alternatives regarding  colonoscopy have been reviewed with the patient.  Questions have been answered.  All parties agreeable.

## 2017-09-05 NOTE — Anesthesia Post-op Follow-up Note (Signed)
Anesthesia QCDR form completed.        

## 2017-09-05 NOTE — Transfer of Care (Signed)
Immediate Anesthesia Transfer of Care Note  Patient: Richard Henry  Procedure(s) Performed: COLONOSCOPY WITH PROPOFOL (N/A )  Patient Location: PACU  Anesthesia Type:General  Level of Consciousness: sedated  Airway & Oxygen Therapy: Patient Spontanous Breathing and Patient connected to nasal cannula oxygen  Post-op Assessment: Report given to RN and Post -op Vital signs reviewed and stable  Post vital signs: Reviewed and stable  Last Vitals:  Vitals:   09/05/17 0910 09/05/17 0915  BP: (P) 101/66 101/66  Pulse: (P) 79 80  Resp: (P) 20 14  Temp: (!) (P) 36.1 C (!) 36.1 C  SpO2: (P) 100% 98%    Last Pain:  Vitals:   09/05/17 0910  TempSrc: (P) Tympanic         Complications: No apparent anesthesia complications

## 2017-09-05 NOTE — Anesthesia Preprocedure Evaluation (Signed)
Anesthesia Evaluation  Patient identified by MRN, date of birth, ID band Patient awake    Reviewed: Allergy & Precautions, NPO status , Patient's Chart, lab work & pertinent test results, reviewed documented beta blocker date and time   Airway Mallampati: II  TM Distance: >3 FB     Dental  (+) Chipped   Pulmonary COPD, Current Smoker,           Cardiovascular hypertension, Pt. on medications      Neuro/Psych    GI/Hepatic   Endo/Other    Renal/GU      Musculoskeletal   Abdominal   Peds  Hematology   Anesthesia Other Findings   Reproductive/Obstetrics                             Anesthesia Physical Anesthesia Plan  ASA: III  Anesthesia Plan: General   Post-op Pain Management:    Induction: Intravenous  PONV Risk Score and Plan:   Airway Management Planned:   Additional Equipment:   Intra-op Plan:   Post-operative Plan:   Informed Consent: I have reviewed the patients History and Physical, chart, labs and discussed the procedure including the risks, benefits and alternatives for the proposed anesthesia with the patient or authorized representative who has indicated his/her understanding and acceptance.     Plan Discussed with: CRNA  Anesthesia Plan Comments:         Anesthesia Quick Evaluation

## 2017-09-05 NOTE — Op Note (Signed)
[facility] Gastroenterology Patient Name: Richard Henry Procedure Date: 09/05/2017 8:43 AM MRN: [id number] Account #: [id number] Date of Birth: October 17, 1945 Admit Type: Outpatient Age: 72 Room: [facility] ENDO ROOM 4 Gender: Male Note Status: Finalized Procedure:            Colonoscopy Indications:          Screening for colorectal malignant neoplasm Providers:            Lucilla Lame MD, MD Referring MD:         Kathrine Haddock (Referring MD) Medicines:            Propofol per Anesthesia Complications:        No immediate complications. Procedure:            Pre-Anesthesia Assessment:                       - Prior to the procedure, a History and Physical was                        performed, and patient medications and allergies were                        reviewed. The patient's tolerance of previous                        anesthesia was also reviewed. The risks and benefits of                        the procedure and the sedation options and risks were                        discussed with the patient. All questions were                        answered, and informed consent was obtained. Prior                        Anticoagulants: The patient has taken no previous                        anticoagulant or antiplatelet agents. ASA Grade                        Assessment: II - A patient with mild systemic disease.                        After reviewing the risks and benefits, the patient was                        deemed in satisfactory condition to undergo the                        procedure.                       After obtaining informed consent, the colonoscope was                        passed under direct vision. Throughout the procedure,  the patient's blood pressure, pulse, and oxygen                        saturations were monitored continuously. The                        Colonoscope was introduced through the anus and                         advanced to the the cecum, identified by appendiceal                        orifice and ileocecal valve. The colonoscopy was                        performed without difficulty. The patient tolerated the                        procedure well. The quality of the bowel preparation                        was excellent. Findings:      The perianal and digital rectal examinations were normal.      A 5 mm polyp was found in the descending colon. The polyp was sessile.       The polyp was removed with a cold snare. Resection and retrieval were       complete.      Five sessile polyps were found in the sigmoid colon. The polyps were 3       to 7 mm in size. These polyps were removed with a cold snare. Resection       and retrieval were complete.      Multiple small-mouthed diverticula were found in the sigmoid colon and       descending colon.      A 10 mm polypoid lesion was found appendiceal orifice. The lesion was       submucosal. No bleeding was present. Impression:           - One 5 mm polyp in the descending colon, removed with                        a cold snare. Resected and retrieved.                       - Five 3 to 7 mm polyps in the sigmoid colon, removed                        with a cold snare. Resected and retrieved.                       - Diverticulosis in the sigmoid colon and in the                        descending colon.                       - Polypoid lesion at the appendiceal orifice. Recommendation:       - Discharge patient to home.                       -  Resume previous diet.                       - Continue present medications.                       - Await pathology results.                       - Repeat colonoscopy in 5 years if polyp adenoma and 10                        years if hyperplastic                       - Perform CT scan (computed tomography) of the abdomen                        and pelvis with contrast. Procedure Code(s):    --- Professional  ---                       [phone], Colonoscopy, flexible; with removal of tumor(s),                        polyp(s), or other lesion(s) by snare technique Diagnosis Code(s):    --- Professional ---                       Z12.11, Encounter for screening for malignant neoplasm                        of colon                       D12.5, Benign neoplasm of sigmoid colon                       D12.4, Benign neoplasm of descending colon CPT copyright 2016 American Medical Association. All rights reserved. The codes documented in this report are preliminary and upon coder review may  be revised to meet current compliance requirements. Lucilla Lame MD, MD 09/05/2017 9:14:51 AM This report has been signed electronically. Number of Addenda: 0 Note Initiated On: 09/05/2017 8:43 AM Scope Withdrawal Time: 0 hours 12 minutes 40 seconds  Total Procedure Duration: 0 hours 19 minutes 42 seconds       [facility]

## 2017-09-06 ENCOUNTER — Encounter: Payer: Self-pay | Admitting: Gastroenterology

## 2017-09-07 ENCOUNTER — Ambulatory Visit
Admission: RE | Admit: 2017-09-07 | Discharge: 2017-09-07 | Disposition: A | Discharge status: Home / Self Care | Payer: Medicare Other | Source: Ambulatory Visit | Attending: Oncology | Admitting: Oncology

## 2017-09-07 DIAGNOSIS — I700 Atherosclerosis of aorta: Secondary | ICD-10-CM | POA: Insufficient documentation

## 2017-09-07 DIAGNOSIS — Z122 Encounter for screening for malignant neoplasm of respiratory organs: Secondary | ICD-10-CM

## 2017-09-07 DIAGNOSIS — Z87891 Personal history of nicotine dependence: Secondary | ICD-10-CM

## 2017-09-07 LAB — SURGICAL PATHOLOGY

## 2017-09-11 ENCOUNTER — Encounter: Payer: Self-pay | Admitting: Gastroenterology

## 2017-09-11 ENCOUNTER — Encounter: Payer: Self-pay | Admitting: *Deleted

## 2017-10-09 ENCOUNTER — Other Ambulatory Visit: Payer: Self-pay | Admitting: Unknown Physician Specialty

## 2017-11-08 ENCOUNTER — Encounter: Payer: Self-pay | Admitting: Emergency Medicine

## 2017-11-08 ENCOUNTER — Other Ambulatory Visit: Payer: Self-pay

## 2017-11-08 ENCOUNTER — Emergency Department
Admission: EM | Admit: 2017-11-08 | Discharge: 2017-11-08 | Disposition: A | Discharge status: Home / Self Care | Payer: Medicare Other | Attending: Emergency Medicine | Admitting: Emergency Medicine

## 2017-11-08 DIAGNOSIS — F17210 Nicotine dependence, cigarettes, uncomplicated: Secondary | ICD-10-CM | POA: Diagnosis not present

## 2017-11-08 DIAGNOSIS — L02211 Cutaneous abscess of abdominal wall: Secondary | ICD-10-CM | POA: Insufficient documentation

## 2017-11-08 DIAGNOSIS — I493 Ventricular premature depolarization: Secondary | ICD-10-CM | POA: Insufficient documentation

## 2017-11-08 DIAGNOSIS — R1900 Intra-abdominal and pelvic swelling, mass and lump, unspecified site: Secondary | ICD-10-CM | POA: Diagnosis present

## 2017-11-08 DIAGNOSIS — Z8546 Personal history of malignant neoplasm of prostate: Secondary | ICD-10-CM | POA: Diagnosis not present

## 2017-11-08 DIAGNOSIS — Z79899 Other long term (current) drug therapy: Secondary | ICD-10-CM | POA: Diagnosis not present

## 2017-11-08 DIAGNOSIS — J449 Chronic obstructive pulmonary disease, unspecified: Secondary | ICD-10-CM | POA: Diagnosis not present

## 2017-11-08 DIAGNOSIS — I10 Essential (primary) hypertension: Secondary | ICD-10-CM | POA: Diagnosis not present

## 2017-11-08 DIAGNOSIS — L0291 Cutaneous abscess, unspecified: Secondary | ICD-10-CM

## 2017-11-08 LAB — MAGNESIUM: MAGNESIUM: 2 mg/dL (ref 1.7–2.4)

## 2017-11-08 LAB — CBC
HCT: 43.5 % (ref 40.0–52.0)
HEMOGLOBIN: 14.9 g/dL (ref 13.0–18.0)
MCH: 32.1 pg (ref 26.0–34.0)
MCHC: 34.3 g/dL (ref 32.0–36.0)
MCV: 93.7 fL (ref 80.0–100.0)
Platelets: 245 10*3/uL (ref 150–440)
RBC: 4.64 MIL/uL (ref 4.40–5.90)
RDW: 13.3 % (ref 11.5–14.5)
WBC: 7.6 10*3/uL (ref 3.8–10.6)

## 2017-11-08 LAB — BASIC METABOLIC PANEL
Anion gap: 8 (ref 5–15)
BUN: 22 mg/dL — AB (ref 6–20)
CHLORIDE: 99 mmol/L — AB (ref 101–111)
CO2: 29 mmol/L (ref 22–32)
Calcium: 9.7 mg/dL (ref 8.9–10.3)
Creatinine, Ser: 1.14 mg/dL (ref 0.61–1.24)
GFR calc Af Amer: >60 mL/min (ref >60)
GFR calc non Af Amer: >60 mL/min (ref >60)
GLUCOSE: 103 mg/dL — AB (ref 65–99)
POTASSIUM: 4.3 mmol/L (ref 3.5–5.1)
Sodium: 136 mmol/L (ref 135–145)

## 2017-11-08 MED ORDER — CLINDAMYCIN HCL 300 MG PO CAPS: 300.0000 mg | ORAL_CAPSULE | Freq: Three times a day (TID) | ORAL | 0 refills | Status: AC

## 2017-11-08 MED ORDER — LIDOCAINE-EPINEPHRINE (PF) 2 %-1:200000 IJ SOLN
10.0000 mL | Freq: Once | INTRAMUSCULAR | Status: DC
  Filled 2017-11-08: qty 10

## 2017-11-08 MED ORDER — MUPIROCIN CALCIUM 2 % EX CREA: TOPICAL_CREAM | CUTANEOUS | 0 refills | Status: DC

## 2017-11-08 NOTE — ED Notes (Signed)
Spoke with pt about wait times and what to expect next. Advised pt that I am available for further questions if needed.  

## 2017-11-08 NOTE — ED Triage Notes (Signed)
Patient states he had an abscess in the same place over a year ago, Dr. Jimmye Norman "dug it out" but it never completely went away.  States about 1.5 weeks ago while out of town, area came to head and drained "a lot of yellow stuff".  Open area noted abdomen covered with a dressing.

## 2017-11-08 NOTE — ED Notes (Signed)
AAOx3.  Skin warm and dry.  NAD 

## 2017-11-08 NOTE — ED Provider Notes (Signed)
[facility] Emergency Department Provider Note  ____________________________________________   I have reviewed the triage vital signs and the nursing notes. Where available I have reviewed prior notes and, if possible and indicated, outside hospital notes.    HISTORY  Chief Complaint Abscess    HPI Richard Henry is a 72 y.o. male with a history of what appears to be a sebaceous cyst on his abdomen which sometimes "flares up".  He has had it drained in the past.  He states that it "flared up several weeks ago and is been draining pus for a little while.  No fever no chills no chest pain or shortness of breath no nausea no vomiting no systemic illness, no surrounding redness no fever, he states that it is not markedly tender although it is uncomfortable.  He has not seen a dermatologist for this.   Past Medical History:  Diagnosis Date  . Cancer [facility])    prostate  . Glaucoma   . Hyperlipidemia   . Hypertension   . Personal history of tobacco use, presenting hazards to health 08/06/2015  . Tobacco use disorder     Patient Active Problem List   Diagnosis Date Noted  . Screening for colon cancer   . Polyp of sigmoid colon   . Benign neoplasm of descending colon   . Muscle cramps 05/17/2017  . Advanced care planning/counseling discussion 07/18/2016  . Personal history of tobacco use, presenting hazards to health 08/06/2015  . Cancer of prostate ([place]) 05/06/2015  . Elevated prostate specific antigen (PSA) 05/06/2015  . COPD (chronic obstructive pulmonary disease) ([place]) 05/06/2015  . Tobacco use disorder 05/06/2015  . Hypertension 05/06/2015  . Hyperlipidemia 05/06/2015    Past Surgical History:  Procedure Laterality Date  . COLONOSCOPY WITH PROPOFOL N/A 09/05/2017   Procedure: COLONOSCOPY WITH PROPOFOL;  Surgeon: Lucilla Lame, MD;  Location: [facility] ENDOSCOPY;  Service: Endoscopy;  Laterality: N/A;  . EYE SURGERY    . PROSTATE SURGERY  06/28/12  . RETINAL  DETACHMENT SURGERY      Prior to Admission medications   Medication Sig Start Date End Date Taking? Authorizing Provider  lisinopril-hydrochlorothiazide (PRINZIDE,ZESTORETIC) 10-12.5 MG tablet TAKE 1 TABLET BY MOUTH  DAILY 03/14/17   Kathrine Haddock, NP  Multiple Vitamin (MULTIVITAMIN) tablet Take 1 tablet by mouth daily.    [provider]  pravastatin (PRAVACHOL) 40 MG tablet TAKE 1 TABLET BY MOUTH  DAILY 10/09/17   Kathrine Haddock, NP  timolol (TIMOPTIC) 0.5 % ophthalmic solution Administer 1 drop to both eyes daily.  07/04/13   [provider]    Allergies Patient has no known allergies.  Family History  Problem Relation Age of Onset  . Hypertension Mother   . Osteoporosis Mother   . Cancer Father        lung  . Hypertension Father     Social History Social History   Tobacco Use  . Smoking status: Current Some Day Smoker    Packs/day: 0.00    Years: 35.00    Pack years: 0.00    Types: Cigarettes  . Smokeless tobacco: Never Used  Substance Use Topics  . Alcohol use: Yes    Alcohol/week: 1.2 oz    Types: 2 Cans of beer per week  . Drug use: No    Review of Systems Constitutional: No fever/chills Eyes: No visual changes. ENT: No sore throat. No stiff neck no neck pain Cardiovascular: Denies chest pain. Respiratory: Denies shortness of breath. Gastrointestinal:   no vomiting.  No diarrhea.  No constipation. Genitourinary: Negative for dysuria. Musculoskeletal: Negative lower extremity swelling Skin: See HPI Neurological: Negative for severe headaches, focal weakness or numbness.   ____________________________________________   PHYSICAL EXAM:  VITAL SIGNS: ED Triage Vitals  Enc Vitals Group     BP 11/08/17 1055 (!) 166/97     Pulse Rate 11/08/17 1055 72     Resp 11/08/17 1055 20     Temp 11/08/17 1055 97.8 F (36.6 C)     Temp Source 11/08/17 1055 Oral     SpO2 11/08/17 1055 100 %     Weight 11/08/17 1057 171 lb (77.6 kg)     Height  11/08/17 1057 5\' 11"  (1.803 m)     Head Circumference --      Peak Flow --      Pain Score 11/08/17 1057 0     Pain Loc --      Pain Edu? --      Excl. in [place]? --     Constitutional: Alert and oriented. Well appearing and in no acute distress. Eyes: Conjunctivae are normal Head: Atraumatic HEENT: No congestion/rhinnorhea. Mucous membranes are moist.  Oropharynx non-erythematous Neck:   Nontender with no meningismus, no masses, no stridor Cardiovascular: Normal rate, regular PACs noted. Grossly normal heart sounds.  Good peripheral circulation. Respiratory: Normal respiratory effort.  No retractions. Lungs CTAB. Abdominal: Soft and nontender. No distention. No guarding no rebound Back:  There is no focal tenderness or step off.  there is no midline tenderness there are no lesions noted. there is no CVA tenderness Musculoskeletal: No lower extremity tenderness, no upper extremity tenderness. No joint effusions, no DVT signs strong distal pulses no edema Neurologic:  Normal speech and language. No gross focal neurologic deficits are appreciated.  Skin:  Skin is warm, dry and intact.  Is an area of perhaps 2 cm of induration with some purulent appearing drainage to the abdomen just above into the right of the umbilicus.  No surrounding cellulitic changes no abdominal tenderness Psychiatric: Mood and affect are normal. Speech and behavior are normal.  ____________________________________________   LABS (all labs ordered are listed, but only abnormal results are displayed)  Labs Reviewed  BASIC METABOLIC PANEL - Abnormal; Notable for the following components:      Result Value   Chloride 99 (*)    Glucose, Bld 103 (*)    BUN 22 (*)    All other components within normal limits  AEROBIC CULTURE (SUPERFICIAL SPECIMEN)  CBC  MAGNESIUM    Pertinent labs  results that were available during my care of the patient were reviewed by me and considered in my medical decision making (see chart  for details). ____________________________________________  EKG  I personally interpreted any EKGs ordered by me or triage EKG was performed because it was noted the patient seemed to have a dysrhythmia, he does have bigeminy, however he is heart rate is in the 80s with no acute ischemia noted ____________________________________________  RADIOLOGY  Pertinent labs & imaging results that were available during my care of the patient were reviewed by me and considered in my medical decision making (see chart for details). If possible, patient and/or family made aware of any abnormal findings.  No results found. ____________________________________________    PROCEDURES  Procedure(s) performed: INCISION AND DRAINAGE Performed by: Schuyler Amor Consent: Verbal consent obtained. Risks and benefits: risks, benefits and alternatives were discussed Type: abscess  Body area: Abdominal wall  Anesthesia: local infiltration  Incision was  made with a scalpel.  Local anesthetic: lidocaine 1 % w epinephrine  Anesthetic total: 4 ml  Complexity: complex Blunt dissection to break up loculations  Drainage: Mostly bloody some purulent  Drainage amount: Scant  Packing material: 1/4 in iodoform gauze  Patient tolerance: Patient tolerated the procedure well with no immediate complications.    Procedures  Critical Care performed: None  ____________________________________________   INITIAL IMPRESSION / ASSESSMENT AND PLAN / ED COURSE  Pertinent labs & imaging results that were available during my care of the patient were reviewed by me and considered in my medical decision making (see chart for details).  ----------------------------------------- 2:51 PM on 11/08/2017 -----------------------------------------  Patient here with 2 issues the first is his abscess, is actually been draining for a week and a half is not much pus in there and is not under a lot of pressure but I  did open it up and pack it, we sent wound cultures to see what treatment is required, patient has had several different flares of the same area is probably a cyst, sebaceous I do not see a lot of lining that needs to be removed at this time however.  Certainly does not appear to be a fistula or anything tracking towards the abdominal cavity itself.  Is my hope that now that is open it will heal.  We did pack it he will remove the packing in there to follow-up with PCP.  I will also refer him to dermatology.  Also, patient noted to have multiple PVCs here, he is absolutely asymptomatic with this and no chest pain or shortness of breath he did not note was having to be told him.  We will have him follow closely with urology, extensive return precautions follow-up given and understood.  The patient feels lightheaded, has increased symptoms of an abscess or infection, no chest pain shortness breath etc. he knows he must come back to the emergency room.  Patient very comfortable with this plan.  Given a short course of antibiotics although honestly this already draining abscess appears to be mostly healing from his interventions       ____________________________________________   FINAL CLINICAL IMPRESSION(S) / ED DIAGNOSES  Final diagnoses:  None      This chart was dictated using voice recognition software.  Despite best efforts to proofread,  errors can occur which can change meaning.      Schuyler Amor, MD 11/08/17 [phone]

## 2017-11-08 NOTE — Discharge Instructions (Addendum)
Remove the packing in 2 days, return to the emergency room for increasing redness, fever, vomiting or other concerns.  If you have chest pain or shortness of breath, return to the emergency room.  We do notice that you are having early beats on your heart, this is something you should see a cardiologist for.  If you feel lightheaded or have chest pain shortness of breath etc. as discussed.  Please return.

## 2017-11-08 NOTE — ED Notes (Signed)
EKG done and reviewed with Dr. Alfred Levins.  EKG shows couplets of PVC's.  Labs ordered by Dr. Alfred Levins - CBC, BMP, Mg

## 2017-11-08 NOTE — ED Notes (Signed)
The monitor read patients heart rate as 45, checked radial pulse - slight irregular.  Patient denies any symptoms, states he was unaware of irregular HR.

## 2017-11-08 NOTE — ED Notes (Signed)
Patient denies pain and is resting comfortably.  

## 2017-11-11 LAB — AEROBIC CULTURE W GRAM STAIN (SUPERFICIAL SPECIMEN): Special Requests: NORMAL

## 2017-11-11 LAB — AEROBIC CULTURE  (SUPERFICIAL SPECIMEN)

## 2017-11-17 ENCOUNTER — Encounter: Payer: Self-pay | Admitting: Unknown Physician Specialty

## 2017-11-17 ENCOUNTER — Ambulatory Visit (INDEPENDENT_AMBULATORY_CARE_PROVIDER_SITE_OTHER): Payer: Medicare Other | Admitting: Unknown Physician Specialty

## 2017-11-17 VITALS — BP 125/78 | HR 59 | Temp 97.4°F | Ht 69.5 in | Wt 179.5 lb

## 2017-11-17 DIAGNOSIS — R5383 Other fatigue: Secondary | ICD-10-CM

## 2017-11-17 DIAGNOSIS — I493 Ventricular premature depolarization: Secondary | ICD-10-CM

## 2017-11-17 DIAGNOSIS — E782 Mixed hyperlipidemia: Secondary | ICD-10-CM

## 2017-11-17 DIAGNOSIS — I10 Essential (primary) hypertension: Secondary | ICD-10-CM

## 2017-11-17 NOTE — Assessment & Plan Note (Signed)
Check lipid panel today 

## 2017-11-17 NOTE — Assessment & Plan Note (Signed)
Stable, continue present medications.   

## 2017-11-17 NOTE — Assessment & Plan Note (Signed)
New problem.  Work-up with cardiology in process.  Will continue to follow

## 2017-11-17 NOTE — Progress Notes (Addendum)
BP 125/78   Pulse (!) 59   Temp (!) 97.4 F (36.3 C) (Oral)   Ht 5' 9.5" (1.765 m)   Wt 179 lb 8 oz (81.4 kg)   SpO2 100%   BMI 26.13 kg/m    Subjective:    Patient ID: Richard Henry, male    DOB: [birth year], 72 y.o.   MRN: [id number]  HPI: Richard Henry is a 72 y.o. male  Chief Complaint  Patient presents with  . Hyperlipidemia  . Hypertension   Hypertension Using medications without difficulty Average home BPs    No problems or lightheadedness No chest pain with exertion or shortness of breath No Edema   Hyperlipidemia Using medications without problems: Taking Pravastatin.  Intolerant to Atorvastatin No Muscle aches  Diet compliance:Exercise: Work around [employer].    Fatigue Went to the ER for a Sebaceous cyst last week.  Labs were done with a normal BMP and CBC. He assumes oit is due to the PVCs as noted below    PVCs While in the ER.  PVCs were noted.  Work-up being done through cardiology.  EKG reviewed.  No TSH  Social History   Socioeconomic History  . Marital status: Married    Spouse name: Not on file  . Number of children: Not on file  . Years of education: Not on file  . Highest education level: Not on file  Occupational History  . Not on file  Social Needs  . Financial resource strain: Not hard at all  . Food insecurity:    Worry: Never true    Inability: Never true  . Transportation needs:    Medical: No    Non-medical: No  Tobacco Use  . Smoking status: Current Some Day Smoker    Packs/day: 0.00    Years: 35.00    Pack years: 0.00    Types: Cigarettes  . Smokeless tobacco: Never Used  Substance and Sexual Activity  . Alcohol use: Yes    Alcohol/week: 1.2 oz    Types: 2 Cans of beer per week  . Drug use: No  . Sexual activity: Yes  Lifestyle  . Physical activity:    Days per week: 0 days    Minutes per session: 0 min  . Stress: Not at all  Relationships  . Social connections:    Talks on phone: More than three times a week      Gets together: More than three times a week    Attends religious service: Never    Active member of club or organization: Yes    Attends meetings of clubs or organizations: More than 4 times per year    Relationship status: Married  . Intimate partner violence:    Fear of current or ex partner: No    Emotionally abused: No    Physically abused: No    Forced sexual activity: No  Other Topics Concern  . Not on file  Social History Narrative  . Not on file      Relevant past medical, surgical, family and social history reviewed and updated as indicated. Interim medical history since our last visit reviewed. Allergies and medications reviewed and updated.  Review of Systems  Per HPI unless specifically indicated above     Objective:    BP 125/78   Pulse (!) 59   Temp (!) 97.4 F (36.3 C) (Oral)   Ht 5' 9.5" (1.765 m)   Wt 179 lb 8 oz (81.4 kg)   SpO2  100%   BMI 26.13 kg/m   Wt Readings from Last 3 Encounters:  11/17/17 179 lb 8 oz (81.4 kg)  11/08/17 171 lb (77.6 kg)  09/07/17 173 lb (78.5 kg)    Physical Exam  Constitutional: He is oriented to person, place, and time. He appears well-developed and well-nourished. No distress.  HENT:  Head: Normocephalic and atraumatic.  Eyes: Conjunctivae and lids are normal. Right eye exhibits no discharge. Left eye exhibits no discharge. No scleral icterus.  Neck: Normal range of motion. Neck supple. No JVD present. Carotid bruit is not present.  Cardiovascular: Normal rate, regular rhythm and normal heart sounds.  Pulmonary/Chest: Effort normal and breath sounds normal. No respiratory distress.  Abdominal: Normal appearance. There is no splenomegaly or hepatomegaly.  Musculoskeletal: Normal range of motion.  Neurological: He is alert and oriented to person, place, and time.  Skin: Skin is warm, dry and intact. No rash noted. No pallor.  Psychiatric: He has a normal mood and affect. His behavior is normal. Judgment and thought  content normal.      Assessment & Plan:   Problem List Items Addressed This Visit      Unprioritized   Hyperlipidemia    Check lipid panel today       Relevant Orders   Lipid Panel w/o Chol/HDL Ratio   Hypertension    Stable, continue present medications.        PVC's (premature ventricular contractions)    New problem.  Work-up with cardiology in process.  Will continue to follow       Other Visit Diagnoses    Fatigue, unspecified type    -  Primary   New problem.  Check TSH for fatigue plus palpitations.  CBC and BMP was normal.  Check Calcium   Relevant Orders   Calcium   TSH       Follow up plan: Return in about 6 months (around 05/19/2018) for physical.

## 2017-11-18 LAB — CALCIUM: CALCIUM: 9.9 mg/dL (ref 8.6–10.2)

## 2017-11-18 LAB — LIPID PANEL W/O CHOL/HDL RATIO
Cholesterol, Total: 201 mg/dL — ABNORMAL HIGH (ref 100–199)
HDL: 59 mg/dL (ref >39)
LDL Calculated: 126 mg/dL — ABNORMAL HIGH (ref 0–99)
TRIGLYCERIDES: 81 mg/dL (ref 0–149)
VLDL CHOLESTEROL CAL: 16 mg/dL (ref 5–40)

## 2017-11-18 LAB — TSH: TSH: 0.921 u[IU]/mL (ref 0.450–4.500)

## 2017-11-20 ENCOUNTER — Encounter: Payer: Self-pay | Admitting: Unknown Physician Specialty

## 2017-11-21 ENCOUNTER — Other Ambulatory Visit: Payer: Self-pay | Admitting: Unknown Physician Specialty

## 2017-11-21 MED ORDER — ATORVASTATIN CALCIUM 20 MG PO TABS: 20.0000 mg | ORAL_TABLET | Freq: Every day | ORAL | 3 refills | Status: DC

## 2018-01-04 ENCOUNTER — Other Ambulatory Visit: Payer: Self-pay | Admitting: Unknown Physician Specialty

## 2018-01-04 DIAGNOSIS — I10 Essential (primary) hypertension: Secondary | ICD-10-CM

## 2018-01-24 ENCOUNTER — Other Ambulatory Visit: Payer: Self-pay

## 2018-01-24 DIAGNOSIS — E782 Mixed hyperlipidemia: Secondary | ICD-10-CM

## 2018-01-24 MED ORDER — ATORVASTATIN CALCIUM 20 MG PO TABS: 20.0000 mg | ORAL_TABLET | Freq: Every day | ORAL | 0 refills | Status: DC

## 2018-01-24 NOTE — Telephone Encounter (Signed)
Patient last seen 11/17/17 and has appointment 05/25/18.

## 2018-03-14 ENCOUNTER — Other Ambulatory Visit: Payer: Self-pay | Admitting: Physician Assistant

## 2018-03-14 DIAGNOSIS — E782 Mixed hyperlipidemia: Secondary | ICD-10-CM

## 2018-05-25 ENCOUNTER — Encounter: Payer: Self-pay | Admitting: Family Medicine

## 2018-05-25 ENCOUNTER — Ambulatory Visit (INDEPENDENT_AMBULATORY_CARE_PROVIDER_SITE_OTHER): Payer: Medicare Other | Admitting: Family Medicine

## 2018-05-25 VITALS — BP 139/87 | HR 69 | Temp 97.7°F | Ht 69.5 in | Wt 184.8 lb

## 2018-05-25 DIAGNOSIS — Z23 Encounter for immunization: Secondary | ICD-10-CM | POA: Diagnosis not present

## 2018-05-25 DIAGNOSIS — E782 Mixed hyperlipidemia: Secondary | ICD-10-CM | POA: Diagnosis not present

## 2018-05-25 DIAGNOSIS — I10 Essential (primary) hypertension: Secondary | ICD-10-CM | POA: Diagnosis not present

## 2018-05-25 NOTE — Patient Instructions (Signed)

## 2018-05-25 NOTE — Progress Notes (Signed)
BP 139/87   Pulse 69   Temp 97.7 F (36.5 C) (Oral)   Ht 5' 9.5" (1.765 m)   Wt 184 lb 12.8 oz (83.8 kg)   SpO2 92%   BMI 26.90 kg/m    Subjective:    Patient ID: Richard Henry, male    DOB: [birth year], 72 y.o.   MRN: [id number]  HPI: Richard Henry is a 72 y.o. male  Chief Complaint  Patient presents with  . Hyperlipidemia  . Hypertension   Here today for 6 month BP and chol f/u. Taking medications faithfully without side effects. Was switched from pravastatin to atorvastatin for greater cholesterol control and tolerating well. Does not check BPs at home, but typically WNL when checked at appts. Denies CP, SOB, dizziness, HAs, claudication, myalgias.   Relevant past medical, surgical, family and social history reviewed and updated as indicated. Interim medical history since our last visit reviewed. Allergies and medications reviewed and updated.  Review of Systems  Per HPI unless specifically indicated above     Objective:    BP 139/87   Pulse 69   Temp 97.7 F (36.5 C) (Oral)   Ht 5' 9.5" (1.765 m)   Wt 184 lb 12.8 oz (83.8 kg)   SpO2 92%   BMI 26.90 kg/m   Wt Readings from Last 3 Encounters:  05/25/18 184 lb 12.8 oz (83.8 kg)  11/17/17 179 lb 8 oz (81.4 kg)  11/08/17 171 lb (77.6 kg)    Physical Exam  Constitutional: He is oriented to person, place, and time. He appears well-developed and well-nourished. No distress.  HENT:  Head: Atraumatic.  Eyes: Pupils are equal, round, and reactive to light. Conjunctivae and EOM are normal.  Neck: Normal range of motion. Neck supple.  Cardiovascular: Normal rate, regular rhythm and normal heart sounds.  Pulmonary/Chest: Effort normal and breath sounds normal.  Musculoskeletal: Normal range of motion.  Neurological: He is alert and oriented to person, place, and time.  Skin: Skin is warm and dry.  Psychiatric: He has a normal mood and affect. His behavior is normal. Thought content normal.  Nursing note and vitals  reviewed.   Results for orders placed or performed in visit on 05/25/18  Comprehensive metabolic panel  Result Value Ref Range   Glucose 88 65 - 99 mg/dL   BUN 20 8 - 27 mg/dL   Creatinine, Ser 1.16 0.76 - 1.27 mg/dL   GFR calc non Af Amer 63 >59 mL/min/1.73   GFR calc Af Amer 73 >59 mL/min/1.73   BUN/Creatinine Ratio 17 10 - 24   Sodium 137 134 - 144 mmol/L   Potassium 5.1 3.5 - 5.2 mmol/L   Chloride 100 96 - 106 mmol/L   CO2 25 20 - 29 mmol/L   Calcium 9.7 8.6 - 10.2 mg/dL   Total Protein 6.2 6.0 - 8.5 g/dL   Albumin 4.2 3.5 - 4.8 g/dL   Globulin, Total 2.0 1.5 - 4.5 g/dL   Albumin/Globulin Ratio 2.1 1.2 - 2.2   Bilirubin Total 0.4 0.0 - 1.2 mg/dL   Alkaline Phosphatase 53 39 - 117 IU/L   AST 24 0 - 40 IU/L   ALT 28 0 - 44 IU/L  Lipid Panel w/o Chol/HDL Ratio  Result Value Ref Range   Cholesterol, Total 197 100 - 199 mg/dL   Triglycerides 80 0 - 149 mg/dL   HDL 63 >39 mg/dL   VLDL Cholesterol Cal 16 5 - 40 mg/dL   LDL Calculated 118 (H) 0 -  99 mg/dL      Assessment & Plan:   Problem List Items Addressed This Visit      Cardiovascular and Mediastinum   Hypertension - Primary    BPs stable and WNL, continue current regimen. DASH diet, exercise encouraged      Relevant Orders   Comprehensive metabolic panel (Completed)     Other   Hyperlipidemia    Recheck lipids today, adjust as needed      Relevant Orders   Lipid Panel w/o Chol/HDL Ratio (Completed)    Other Visit Diagnoses    Need for influenza vaccination       Relevant Orders   Flu vaccine HIGH DOSE PF (Completed)       Follow up plan: Return in about 6 months (around 11/24/2018) for CPE with me, AWV with Tiffany.

## 2018-05-26 LAB — COMPREHENSIVE METABOLIC PANEL
ALBUMIN: 4.2 g/dL (ref 3.5–4.8)
ALK PHOS: 53 IU/L (ref 39–117)
ALT: 28 IU/L (ref 0–44)
AST: 24 IU/L (ref 0–40)
Albumin/Globulin Ratio: 2.1 (ref 1.2–2.2)
BILIRUBIN TOTAL: 0.4 mg/dL (ref 0.0–1.2)
BUN/Creatinine Ratio: 17 (ref 10–24)
BUN: 20 mg/dL (ref 8–27)
CHLORIDE: 100 mmol/L (ref 96–106)
CO2: 25 mmol/L (ref 20–29)
CREATININE: 1.16 mg/dL (ref 0.76–1.27)
Calcium: 9.7 mg/dL (ref 8.6–10.2)
GFR calc Af Amer: 73 mL/min/{1.73_m2} (ref >59)
GFR calc non Af Amer: 63 mL/min/{1.73_m2} (ref >59)
GLUCOSE: 88 mg/dL (ref 65–99)
Globulin, Total: 2 g/dL (ref 1.5–4.5)
Potassium: 5.1 mmol/L (ref 3.5–5.2)
Sodium: 137 mmol/L (ref 134–144)
TOTAL PROTEIN: 6.2 g/dL (ref 6.0–8.5)

## 2018-05-26 LAB — LIPID PANEL W/O CHOL/HDL RATIO
CHOLESTEROL TOTAL: 197 mg/dL (ref 100–199)
HDL: 63 mg/dL (ref >39)
LDL Calculated: 118 mg/dL — ABNORMAL HIGH (ref 0–99)
Triglycerides: 80 mg/dL (ref 0–149)
VLDL CHOLESTEROL CAL: 16 mg/dL (ref 5–40)

## 2018-05-26 NOTE — Assessment & Plan Note (Signed)
Recheck lipids today, adjust as needed

## 2018-05-26 NOTE — Assessment & Plan Note (Signed)
BPs stable and WNL, continue current regimen. DASH diet, exercise encouraged

## 2018-05-28 ENCOUNTER — Encounter: Payer: Self-pay | Admitting: Family Medicine

## 2018-05-28 ENCOUNTER — Other Ambulatory Visit: Payer: Self-pay | Admitting: Family Medicine

## 2018-05-28 MED ORDER — ATORVASTATIN CALCIUM 40 MG PO TABS: 40.0000 mg | ORAL_TABLET | Freq: Every day | ORAL | 1 refills | Status: DC

## 2018-05-29 ENCOUNTER — Telehealth: Payer: Self-pay | Admitting: Family Medicine

## 2018-05-29 ENCOUNTER — Other Ambulatory Visit: Payer: Self-pay

## 2018-05-29 NOTE — Patient Outreach (Signed)
[facility]) Care Management  05/29/2018  Monterio Bob 1945/08/05 [id number]   Medication Adherence call to Mr. Tuan Tippin spoke with patient he ask if we can call [facility] an order this medication Lisinopril / HCTZ 10/12.5 mg , [facility] said this medication is on back order and needs a call to the doctor left a message at doctors office to send in a new prescription. Mr. Lamarque is showing past due under [place].   [place] Management Direct Dial [phone]  Fax [phone] [email or web address]

## 2018-05-29 NOTE — Telephone Encounter (Signed)
Copied from [place] [phone]. Topic: Quick Communication - See Telephone Encounter >> May 29, 2018  1:54 PM Rutherford Nail, NT wrote: CRM for notification. See Telephone encounter for: 05/29/18. Richard Henry with [employer] farm manager and states that she was doing medication adhearance for the patient. States that he has not had his lisinopril-hydrochlorothiazide (PRINZIDE,ZESTORETIC) 10-12.5 MG tablet filled since May. She called [facility] and they told her that it is on back order. Would like to know if there is another medication that the patient should be on in place of this? Also, upon looking at the atorvastatin prescription that was sent yesterday, that was sent to the wrong pharmacy. Patient would like that to also go to [facility]. Please advise.  CB#: [place], [place] - [address]

## 2018-05-30 ENCOUNTER — Other Ambulatory Visit: Payer: Self-pay

## 2018-05-30 MED ORDER — LISINOPRIL 10 MG PO TABS: 10.0000 mg | ORAL_TABLET | Freq: Every day | ORAL | 1 refills | Status: DC

## 2018-05-30 MED ORDER — ATORVASTATIN CALCIUM 40 MG PO TABS: 40.0000 mg | ORAL_TABLET | Freq: Every day | ORAL | 1 refills | Status: DC

## 2018-05-30 MED ORDER — HYDROCHLOROTHIAZIDE 12.5 MG PO TABS: 12.5000 mg | ORAL_TABLET | Freq: Every day | ORAL | 1 refills | Status: DC

## 2018-05-30 NOTE — Patient Outreach (Signed)
[facility]) Care Management  05/30/2018  [name] [birth year] [id number]   Medication Adherence call to [name]  patient is showing past due on Lisinopril/HCTZ 10/12.5 mg we have left a call to the doctor's office to call in a prescription for this medication but, doctor call back saying patient is no longer on this medication he is now taking Metoprol 25 mg. patient is aware but forgot to tell me when I call yesterday. Patient is showing past due under [place].   [place] Management Direct Dial [phone]  Fax [phone] [email or web address]

## 2018-05-30 NOTE — Telephone Encounter (Signed)
Noted, thank you

## 2018-05-30 NOTE — Telephone Encounter (Signed)
Called and spoke with patient. He is not on lisinopril or HCTZ. Dr. Ubaldo Glassing changed him to metoprolol 25 mg and he forgot when he was here in office. Medication list was updated. Called and made [name] aware.

## 2018-05-30 NOTE — Telephone Encounter (Signed)
Split lisinopril HCTZ into separate tabs as it's on back order and changed lipitor to [facility]. Pt stated during recent OV that he was taking his BP medication, please alert him of this change and remind him to be taking it daily

## 2018-07-20 ENCOUNTER — Ambulatory Visit: Payer: Self-pay

## 2018-08-22 ENCOUNTER — Encounter: Payer: Self-pay | Admitting: *Deleted

## 2018-08-24 ENCOUNTER — Encounter: Payer: Self-pay | Admitting: *Deleted

## 2018-08-24 ENCOUNTER — Telehealth: Payer: Self-pay

## 2018-08-24 ENCOUNTER — Telehealth: Payer: Self-pay | Admitting: *Deleted

## 2018-08-24 DIAGNOSIS — Z122 Encounter for screening for malignant neoplasm of respiratory organs: Secondary | ICD-10-CM

## 2018-08-24 NOTE — Telephone Encounter (Signed)
Patient has been notified that the annual lung cancer screening low dose CT scan is due currently or will be in the near future.  Confirmed that the patient is within the age range of 80-80, and asymptomatic, and currently exhibits no signs or symptoms of lung cancer.  Patient denies illness that would prevent curative treatment for lung cancer if found.  Verified smoking history, current smoker with [id number] .  The shared decision making visit was completed on 08-07-15.  Patient is agreeable for the CT scan to be scheduled.  Will call patient back with date and time of appointment.

## 2018-08-24 NOTE — Telephone Encounter (Signed)
Call pt regarding lung screening. PT is a current smoker , smoking about 1/2 pack per day. Pt would like scan any morning. Denies amy new health issues at this time.

## 2018-08-29 ENCOUNTER — Telehealth: Payer: Self-pay | Admitting: *Deleted

## 2018-08-29 NOTE — Telephone Encounter (Signed)
Called pt to inform him of his appt for ldct screening on Monday 09/10/2018 here @ [place] @ 9:40am.unable to keep appt r/t has to be in court

## 2018-09-05 ENCOUNTER — Telehealth: Payer: Self-pay | Admitting: *Deleted

## 2018-09-05 NOTE — Telephone Encounter (Signed)
Called pt r/t appt for ldct agreeable to date and time, sent to scheduling

## 2018-09-06 ENCOUNTER — Telehealth: Payer: Self-pay | Admitting: *Deleted

## 2018-09-06 NOTE — Telephone Encounter (Signed)
Patient notified/message left to notify patient that due to current restrictions lung screening appointments are cancelled and patient will be contacted regarding rescheduling.  

## 2018-09-10 ENCOUNTER — Inpatient Hospital Stay: Admission: RE | Admit: 2018-09-10 | Payer: Medicare Other | Source: Ambulatory Visit

## 2018-09-10 ENCOUNTER — Ambulatory Visit: Payer: Medicare Other

## 2018-10-29 ENCOUNTER — Telehealth: Payer: Self-pay | Admitting: *Deleted

## 2018-10-29 NOTE — Telephone Encounter (Signed)
Left message for patient to notify them that it is time to schedule annual low dose lung cancer screening CT scan. Instructed patient to call back to verify information prior to the scan being scheduled.  

## 2018-11-30 ENCOUNTER — Encounter: Payer: Medicare Other | Admitting: Family Medicine

## 2018-12-05 ENCOUNTER — Other Ambulatory Visit: Payer: Self-pay

## 2018-12-05 ENCOUNTER — Encounter: Payer: Self-pay | Admitting: Family Medicine

## 2018-12-05 ENCOUNTER — Ambulatory Visit (INDEPENDENT_AMBULATORY_CARE_PROVIDER_SITE_OTHER): Payer: Medicare Other | Admitting: Family Medicine

## 2018-12-05 ENCOUNTER — Ambulatory Visit: Payer: Self-pay

## 2018-12-05 VITALS — BP 123/85 | HR 66 | Temp 98.3°F | Ht 70.0 in | Wt 186.0 lb

## 2018-12-05 DIAGNOSIS — J449 Chronic obstructive pulmonary disease, unspecified: Secondary | ICD-10-CM

## 2018-12-05 DIAGNOSIS — E782 Mixed hyperlipidemia: Secondary | ICD-10-CM | POA: Diagnosis not present

## 2018-12-05 DIAGNOSIS — Z0000 Encounter for general adult medical examination without abnormal findings: Secondary | ICD-10-CM

## 2018-12-05 DIAGNOSIS — I10 Essential (primary) hypertension: Secondary | ICD-10-CM

## 2018-12-05 DIAGNOSIS — Z8546 Personal history of malignant neoplasm of prostate: Secondary | ICD-10-CM

## 2018-12-05 LAB — UA/M W/RFLX CULTURE, ROUTINE
Bilirubin, UA: NEGATIVE
Glucose, UA: NEGATIVE
Ketones, UA: NEGATIVE
Leukocytes,UA: NEGATIVE
Nitrite, UA: NEGATIVE
Protein,UA: NEGATIVE
Specific Gravity, UA: 1.02 (ref 1.005–1.030)
Urobilinogen, Ur: 0.2 mg/dL (ref 0.2–1.0)
pH, UA: 6 (ref 5.0–7.5)

## 2018-12-05 LAB — MICROSCOPIC EXAMINATION
Bacteria, UA: NONE SEEN
WBC, UA: NONE SEEN /hpf (ref 0–5)

## 2018-12-05 NOTE — Progress Notes (Signed)
BP 123/85   Pulse 66   Temp 98.3 F (36.8 C) (Oral)   Ht 5\' 10"  (1.778 m)   Wt 186 lb (84.4 kg)   SpO2 100%   BMI 26.69 kg/m    Subjective:    Patient ID: Richard Henry, male    DOB: May 05, 1946, 73 y.o.   MRN: [id number]  HPI: Richard Henry is a 73 y.o. male presenting on 12/05/2018 for comprehensive medical examination. Current medical complaints include:see below  He currently lives with: Interim Problems from his last visit: no  Does not check home BPs, states it's always been good so he doesn't typically bother. Taking his metoprolol faithfully without side effects. Denies CP, SOB, HAs, dizziness.   Taking lipitor for cholesterol management without issue. Watching what he eats and staying active. Denies myalgias, claudication.   COPD stable off inhalers, no recent exacerbations.   Hx of prostate cancer s/p prostatectomy. Has not been followed by Urology for several years now, gets PSA checked annually with PCP. No new sxs.   Depression Screen done today and results listed below:  Depression screen [facility] 2/9 12/05/2018 07/19/2017 07/18/2016 07/14/2015  Decreased Interest 0 0 0 0  Down, Depressed, Hopeless 0 0 1 0  PHQ - 2 Score 0 0 1 0  Altered sleeping 1 - 1 -  Tired, decreased energy 1 - 1 -  Change in appetite 0 - 0 -  Feeling bad or failure about yourself  0 - 0 -  Trouble concentrating 0 - 0 -  Moving slowly or fidgety/restless 0 - 0 -  Suicidal thoughts 0 - 0 -  PHQ-9 Score 2 - 3 -    The patient does not have a history of falls. I did complete a risk assessment for falls. A plan of care for falls was documented.   Past Medical History:  Past Medical History:  Diagnosis Date  . Cancer [facility])    prostate  . Glaucoma   . Hyperlipidemia   . Hypertension   . Personal history of tobacco use, presenting hazards to health 08/06/2015  . Tobacco use disorder     Surgical History:  Past Surgical History:  Procedure Laterality Date  . COLONOSCOPY WITH PROPOFOL N/A  09/05/2017   Procedure: COLONOSCOPY WITH PROPOFOL;  Surgeon: Lucilla Lame, MD;  Location: [facility] ENDOSCOPY;  Service: Endoscopy;  Laterality: N/A;  . EYE SURGERY    . PROSTATE SURGERY  06/28/12  . RETINAL DETACHMENT SURGERY      Medications:  Current Outpatient Medications on File Prior to Visit  Medication Sig  . atorvastatin (LIPITOR) 40 MG tablet Take 1 tablet (40 mg total) by mouth daily.  . metoprolol tartrate (LOPRESSOR) 25 MG tablet Take 25 mg by mouth 2 (two) times daily.  . Multiple Vitamin (MULTIVITAMIN) tablet Take 1 tablet by mouth daily.  . timolol (TIMOPTIC) 0.5 % ophthalmic solution Administer 1 drop to both eyes daily.    No current facility-administered medications on file prior to visit.     Allergies:  No Known Allergies  Social History:  Social History   Socioeconomic History  . Marital status: Married    Spouse name: Not on file  . Number of children: Not on file  . Years of education: Not on file  . Highest education level: Not on file  Occupational History  . Not on file  Social Needs  . Financial resource strain: Not hard at all  . Food insecurity    Worry: Never true  Inability: Never true  . Transportation needs    Medical: No    Non-medical: No  Tobacco Use  . Smoking status: Current Some Day Smoker    Packs/day: 0.50    Years: 37.00    Pack years: 18.50    Types: Cigarettes  . Smokeless tobacco: Never Used  Substance and Sexual Activity  . Alcohol use: Yes    Alcohol/week: 2.0 standard drinks    Types: 2 Cans of beer per week  . Drug use: No  . Sexual activity: Yes  Lifestyle  . Physical activity    Days per week: 0 days    Minutes per session: 0 min  . Stress: Not at all  Relationships  . Social connections    Talks on phone: More than three times a week    Gets together: More than three times a week    Attends religious service: Never    Active member of club or organization: Yes    Attends meetings of clubs or organizations:  More than 4 times per year    Relationship status: Married  . Intimate partner violence    Fear of current or ex partner: No    Emotionally abused: No    Physically abused: No    Forced sexual activity: No  Other Topics Concern  . Not on file  Social History Narrative  . Not on file   Social History   Tobacco Use  Smoking Status Current Some Day Smoker  . Packs/day: 0.50  . Years: 37.00  . Pack years: 18.50  . Types: Cigarettes  Smokeless Tobacco Never Used   Social History   Substance and Sexual Activity  Alcohol Use Yes  . Alcohol/week: 2.0 standard drinks  . Types: 2 Cans of beer per week    Family History:  Family History  Problem Relation Age of Onset  . Hypertension Mother   . Osteoporosis Mother   . Cancer Father        lung  . Hypertension Father     Past medical history, surgical history, medications, allergies, family history and social history reviewed with patient today and changes made to appropriate areas of the chart.   Review of Systems - General ROS: negative Psychological ROS: negative Ophthalmic ROS: negative ENT ROS: negative Allergy and Immunology ROS: negative Hematological and Lymphatic ROS: negative Endocrine ROS: negative Respiratory ROS: no cough, shortness of breath, or wheezing Cardiovascular ROS: no chest pain or dyspnea on exertion Gastrointestinal ROS: no abdominal pain, change in bowel habits, or black or bloody stools Genito-Urinary ROS: no dysuria, trouble voiding, or hematuria Musculoskeletal ROS: negative Neurological ROS: no TIA or stroke symptoms Dermatological ROS: negative All other ROS negative except what is listed above and in the HPI.      Objective:    BP 123/85   Pulse 66   Temp 98.3 F (36.8 C) (Oral)   Ht 5\' 10"  (1.778 m)   Wt 186 lb (84.4 kg)   SpO2 100%   BMI 26.69 kg/m   Wt Readings from Last 3 Encounters:  12/05/18 186 lb (84.4 kg)  05/25/18 184 lb 12.8 oz (83.8 kg)  11/17/17 179 lb 8 oz (81.4  kg)    Physical Exam Vitals signs and nursing note reviewed.  Constitutional:      General: He is not in acute distress.    Appearance: He is well-developed.  HENT:     Head: Atraumatic.     Right Ear: Tympanic membrane and external ear  normal.     Left Ear: Tympanic membrane and external ear normal.     Nose: Nose normal.     Mouth/Throat:     Mouth: Mucous membranes are moist.     Pharynx: Oropharynx is clear.  Eyes:     General: No scleral icterus.    Conjunctiva/sclera: Conjunctivae normal.     Pupils: Pupils are equal, round, and reactive to light.  Neck:     Musculoskeletal: Normal range of motion and neck supple.  Cardiovascular:     Rate and Rhythm: Normal rate and regular rhythm.     Heart sounds: Normal heart sounds. No murmur.  Pulmonary:     Effort: Pulmonary effort is normal. No respiratory distress.     Breath sounds: Normal breath sounds.  Abdominal:     General: Bowel sounds are normal. There is no distension.     Palpations: Abdomen is soft. There is no mass.     Tenderness: There is no abdominal tenderness. There is no guarding.  Genitourinary:    Comments: GU exam declined, hx of prostatectomy  Musculoskeletal: Normal range of motion.        General: No tenderness.  Skin:    General: Skin is warm and dry.     Findings: No rash.  Neurological:     General: No focal deficit present.     Mental Status: He is alert and oriented to person, place, and time.     Deep Tendon Reflexes: Reflexes are normal and symmetric.  Psychiatric:        Mood and Affect: Mood normal.        Behavior: Behavior normal.        Thought Content: Thought content normal.        Judgment: Judgment normal.     Results for orders placed or performed in visit on 12/05/18  Microscopic Examination   URINE  Result Value Ref Range   WBC, UA None seen 0 - 5 /hpf   RBC 0-2 0 - 2 /hpf   Epithelial Cells (non renal) 0-10 0 - 10 /hpf   Bacteria, UA None seen None seen/Few  CBC with  Differential/Platelet  Result Value Ref Range   WBC 6.1 3.4 - 10.8 x10E3/uL   RBC 4.63 4.14 - 5.80 x10E6/uL   Hemoglobin 14.4 13.0 - 17.7 g/dL   Hematocrit 43.1 37.5 - 51.0 %   MCV 93 79 - 97 fL   MCH 31.1 26.6 - 33.0 pg   MCHC 33.4 31.5 - 35.7 g/dL   RDW 12.7 11.6 - 15.4 %   Platelets 224 150 - 450 x10E3/uL   Neutrophils 54 Not Estab. %   Lymphs 27 Not Estab. %   Monocytes 11 Not Estab. %   Eos 7 Not Estab. %   Basos 1 Not Estab. %   Neutrophils Absolute 3.3 1.4 - 7.0 x10E3/uL   Lymphocytes Absolute 1.7 0.7 - 3.1 x10E3/uL   Monocytes Absolute 0.7 0.1 - 0.9 x10E3/uL   EOS (ABSOLUTE) 0.4 0.0 - 0.4 x10E3/uL   Basophils Absolute 0.1 0.0 - 0.2 x10E3/uL   Immature Granulocytes 0 Not Estab. %   Immature Grans (Abs) 0.0 0.0 - 0.1 x10E3/uL  Comprehensive metabolic panel  Result Value Ref Range   Glucose 85 65 - 99 mg/dL   BUN 18 8 - 27 mg/dL   Creatinine, Ser 1.13 0.76 - 1.27 mg/dL   GFR calc non Af Amer 65 >59 mL/min/1.73   GFR calc Af Amer 75 >59 mL/min/1.73  BUN/Creatinine Ratio 16 10 - 24   Sodium 140 134 - 144 mmol/L   Potassium 4.7 3.5 - 5.2 mmol/L   Chloride 104 96 - 106 mmol/L   CO2 24 20 - 29 mmol/L   Calcium 9.6 8.6 - 10.2 mg/dL   Total Protein 6.3 6.0 - 8.5 g/dL   Albumin 4.0 3.7 - 4.7 g/dL   Globulin, Total 2.3 1.5 - 4.5 g/dL   Albumin/Globulin Ratio 1.7 1.2 - 2.2   Bilirubin Total 0.2 0.0 - 1.2 mg/dL   Alkaline Phosphatase 52 39 - 117 IU/L   AST 27 0 - 40 IU/L   ALT 23 0 - 44 IU/L  Lipid Panel w/o Chol/HDL Ratio  Result Value Ref Range   Cholesterol, Total 161 100 - 199 mg/dL   Triglycerides 67 0 - 149 mg/dL   HDL 57 >39 mg/dL   VLDL Cholesterol Cal 13 5 - 40 mg/dL   LDL Calculated 91 0 - 99 mg/dL  UA/M w/rflx Culture, Routine   Specimen: Urine   URINE  Result Value Ref Range   Specific Gravity, UA 1.020 1.005 - 1.030   pH, UA 6.0 5.0 - 7.5   Color, UA Yellow Yellow   Appearance Ur Clear Clear   Leukocytes,UA Negative Negative   Protein,UA Negative  Negative/Trace   Glucose, UA Negative Negative   Ketones, UA Negative Negative   RBC, UA Trace (A) Negative   Bilirubin, UA Negative Negative   Urobilinogen, Ur 0.2 0.2 - 1.0 mg/dL   Nitrite, UA Negative Negative   Microscopic Examination See below:   PSA  Result Value Ref Range   Prostate Specific Ag, Serum 0.2 0.0 - 4.0 ng/mL      Assessment & Plan:   Problem List Items Addressed This Visit      Cardiovascular and Mediastinum   Hypertension - Primary    Stable and WNL, continue current regimen, DASH diet, exercise      Relevant Orders   CBC with Differential/Platelet (Completed)   Comprehensive metabolic panel (Completed)   UA/M w/rflx Culture, Routine (Completed)     Respiratory   COPD (chronic obstructive pulmonary disease) (HCC)    Stable off inhalers, f/u if having sxs        Other   Hyperlipidemia    Recheck lipids, continue current regimen and lifestyle modifications      Relevant Orders   Lipid Panel w/o Chol/HDL Ratio (Completed)   History of prostate cancer    No new issues, recheck PSA today      Relevant Orders   PSA (Completed)    Other Visit Diagnoses    Annual physical exam           Discussed aspirin prophylaxis for myocardial infarction prevention  LABORATORY TESTING:  Health maintenance labs ordered today as discussed above.   The natural history of prostate cancer and ongoing controversy regarding screening and potential treatment outcomes of prostate cancer has been discussed with the patient. The meaning of a false positive PSA and a false negative PSA has been discussed. He indicates understanding of the limitations of this screening test and wishes to proceed with screening PSA testing.   IMMUNIZATIONS:   - Tdap: Tetanus vaccination status reviewed: last tetanus booster within 10 years. - Influenza: Postponed to flu season - Pneumovax: Up to date - Prevnar: Up to date - HPV: Not applicable - Zostavax vaccine: Up to date   SCREENING: - Colonoscopy: Up to date  Discussed with patient purpose of the  colonoscopy is to detect colon cancer at curable precancerous or early stages   PATIENT COUNSELING:    Sexuality: Discussed sexually transmitted diseases, partner selection, use of condoms, avoidance of unintended pregnancy  and contraceptive alternatives.   Advised to avoid cigarette smoking.  I discussed with the patient that most people either abstain from alcohol or drink within safe limits (<=14/week and <=4 drinks/occasion for males, <=7/weeks and <= 3 drinks/occasion for females) and that the risk for alcohol disorders and other health effects rises proportionally with the number of drinks per week and how often a drinker exceeds daily limits.  Discussed cessation/primary prevention of drug use and availability of treatment for abuse.   Diet: Encouraged to adjust caloric intake to maintain  or achieve ideal body weight, to reduce intake of dietary saturated fat and total fat, to limit sodium intake by avoiding high sodium foods and not adding table salt, and to maintain adequate dietary potassium and calcium preferably from fresh fruits, vegetables, and low-fat dairy products.    stressed the importance of regular exercise  Injury prevention: Discussed safety belts, safety helmets, smoke detector, smoking near bedding or upholstery.   Dental health: Discussed importance of regular tooth brushing, flossing, and dental visits.   Follow up plan: NEXT PREVENTATIVE PHYSICAL DUE IN 1 YEAR. Return in about 6 months (around 06/06/2019) for 6 month f/u.

## 2018-12-06 ENCOUNTER — Encounter: Payer: Self-pay | Admitting: Family Medicine

## 2018-12-06 LAB — CBC WITH DIFFERENTIAL/PLATELET
Basophils Absolute: 0.1 10*3/uL (ref 0.0–0.2)
Basos: 1 %
EOS (ABSOLUTE): 0.4 10*3/uL (ref 0.0–0.4)
Eos: 7 %
Hematocrit: 43.1 % (ref 37.5–51.0)
Hemoglobin: 14.4 g/dL (ref 13.0–17.7)
Immature Grans (Abs): 0 10*3/uL (ref 0.0–0.1)
Immature Granulocytes: 0 %
Lymphocytes Absolute: 1.7 10*3/uL (ref 0.7–3.1)
Lymphs: 27 %
MCH: 31.1 pg (ref 26.6–33.0)
MCHC: 33.4 g/dL (ref 31.5–35.7)
MCV: 93 fL (ref 79–97)
Monocytes Absolute: 0.7 10*3/uL (ref 0.1–0.9)
Monocytes: 11 %
Neutrophils Absolute: 3.3 10*3/uL (ref 1.4–7.0)
Neutrophils: 54 %
Platelets: 224 10*3/uL (ref 150–450)
RBC: 4.63 x10E6/uL (ref 4.14–5.80)
RDW: 12.7 % (ref 11.6–15.4)
WBC: 6.1 10*3/uL (ref 3.4–10.8)

## 2018-12-06 LAB — COMPREHENSIVE METABOLIC PANEL
ALT: 23 IU/L (ref 0–44)
AST: 27 IU/L (ref 0–40)
Albumin/Globulin Ratio: 1.7 (ref 1.2–2.2)
Albumin: 4 g/dL (ref 3.7–4.7)
Alkaline Phosphatase: 52 IU/L (ref 39–117)
BUN/Creatinine Ratio: 16 (ref 10–24)
BUN: 18 mg/dL (ref 8–27)
Bilirubin Total: 0.2 mg/dL (ref 0.0–1.2)
CO2: 24 mmol/L (ref 20–29)
Calcium: 9.6 mg/dL (ref 8.6–10.2)
Chloride: 104 mmol/L (ref 96–106)
Creatinine, Ser: 1.13 mg/dL (ref 0.76–1.27)
GFR calc Af Amer: 75 mL/min/{1.73_m2} (ref >59)
GFR calc non Af Amer: 65 mL/min/{1.73_m2} (ref >59)
Globulin, Total: 2.3 g/dL (ref 1.5–4.5)
Glucose: 85 mg/dL (ref 65–99)
Potassium: 4.7 mmol/L (ref 3.5–5.2)
Sodium: 140 mmol/L (ref 134–144)
Total Protein: 6.3 g/dL (ref 6.0–8.5)

## 2018-12-06 LAB — PSA: Prostate Specific Ag, Serum: 0.2 ng/mL (ref 0.0–4.0)

## 2018-12-06 LAB — LIPID PANEL W/O CHOL/HDL RATIO
Cholesterol, Total: 161 mg/dL (ref 100–199)
HDL: 57 mg/dL (ref >39)
LDL Calculated: 91 mg/dL (ref 0–99)
Triglycerides: 67 mg/dL (ref 0–149)
VLDL Cholesterol Cal: 13 mg/dL (ref 5–40)

## 2018-12-06 NOTE — Assessment & Plan Note (Signed)
Recheck lipids, continue current regimen and lifestyle modifications

## 2018-12-06 NOTE — Assessment & Plan Note (Signed)
No new issues, recheck PSA today

## 2018-12-06 NOTE — Assessment & Plan Note (Signed)
Stable off inhalers, f/u if having sxs

## 2018-12-06 NOTE — Assessment & Plan Note (Signed)
Stable and WNL, continue current regimen, DASH diet, exercise

## 2018-12-07 ENCOUNTER — Telehealth: Payer: Self-pay

## 2018-12-07 NOTE — Telephone Encounter (Signed)
Call pt regarding lung screening. Unable  to  Leave message for pt to return call. Pt has no VMX.

## 2018-12-19 ENCOUNTER — Ambulatory Visit (INDEPENDENT_AMBULATORY_CARE_PROVIDER_SITE_OTHER): Payer: Medicare Other

## 2018-12-19 DIAGNOSIS — Z0000 Encounter for general adult medical examination without abnormal findings: Secondary | ICD-10-CM | POA: Diagnosis not present

## 2018-12-19 NOTE — Patient Instructions (Signed)
Richard Henry , Thank you for taking time to come for your Medicare Wellness Visit. I appreciate your ongoing commitment to your health goals. Please review the following plan we discussed and let me know if I can assist you in the future.   Screening recommendations/referrals: Colonoscopy: completed 08/26/2017 Recommended yearly ophthalmology/optometry visit for glaucoma screening and checkup Recommended yearly dental visit for hygiene and checkup  Vaccinations: Influenza vaccine: up to date Pneumococcal vaccine: up to date Tdap vaccine: up to date  Shingles vaccine: shingrix eligible, check with your insurance company for coverage   Advanced directives: copy on file  Conditions/risks identified: none   Next appointment: Follow up in one year for your annual wellness exam.   Preventive Care 65 Years and Older, Male Preventive care refers to lifestyle choices and visits with your health care provider that can promote health and wellness. What does preventive care include?  A yearly physical exam. This is also called an annual well check.  Dental exams once or twice a year.  Routine eye exams. Ask your health care provider how often you should have your eyes checked.  Personal lifestyle choices, including:  Daily care of your teeth and gums.  Regular physical activity.  Eating a healthy diet.  Avoiding tobacco and drug use.  Limiting alcohol use.  Practicing safe sex.  Taking low doses of aspirin every day.  Taking vitamin and mineral supplements as recommended by your health care provider. What happens during an annual well check? The services and screenings done by your health care provider during your annual well check will depend on your age, overall health, lifestyle risk factors, and family history of disease. Counseling  Your health care provider may ask you questions about your:  Alcohol use.  Tobacco use.  Drug use.  Emotional well-being.  Home and  relationship well-being.  Sexual activity.  Eating habits.  History of falls.  Memory and ability to understand (cognition).  Work and work Statistician. Screening  You may have the following tests or measurements:  Height, weight, and BMI.  Blood pressure.  Lipid and cholesterol levels. These may be checked every 5 years, or more frequently if you are over 29 years old.  Skin check.  Lung cancer screening. You may have this screening every year starting at age 76 if you have a 30-pack-year history of smoking and currently smoke or have quit within the past 15 years.  Fecal occult blood test (FOBT) of the stool. You may have this test every year starting at age 40.  Flexible sigmoidoscopy or colonoscopy. You may have a sigmoidoscopy every 5 years or a colonoscopy every 10 years starting at age 33.  Prostate cancer screening. Recommendations will vary depending on your family history and other risks.  Hepatitis C blood test.  Hepatitis B blood test.  Sexually transmitted disease (STD) testing.  Diabetes screening. This is done by checking your blood sugar (glucose) after you have not eaten for a while (fasting). You may have this done every 1-3 years.  Abdominal aortic aneurysm (AAA) screening. You may need this if you are a current or former smoker.  Osteoporosis. You may be screened starting at age 1 if you are at high risk. Talk with your health care provider about your test results, treatment options, and if necessary, the need for more tests. Vaccines  Your health care provider may recommend certain vaccines, such as:  Influenza vaccine. This is recommended every year.  Tetanus, diphtheria, and acellular pertussis (Tdap, Td)  vaccine. You may need a Td booster every 10 years.  Zoster vaccine. You may need this after age 42.  Pneumococcal 13-valent conjugate (PCV13) vaccine. One dose is recommended after age 4.  Pneumococcal polysaccharide (PPSV23) vaccine. One  dose is recommended after age 2. Talk to your health care provider about which screenings and vaccines you need and how often you need them. This information is not intended to replace advice given to you by your health care provider. Make sure you discuss any questions you have with your health care provider. Document Released: 07/03/2015 Document Revised: 02/24/2016 Document Reviewed: 04/07/2015 Elsevier Interactive Patient Education  2017 [employer] Prevention in the Home Falls can cause injuries. They can happen to people of all ages. There are many things you can do to make your home safe and to help prevent falls. What can I do on the outside of my home?  Regularly fix the edges of walkways and driveways and fix any cracks.  Remove anything that might make you trip as you walk through a door, such as a raised step or threshold.  Trim any bushes or trees on the path to your home.  Use bright outdoor lighting.  Clear any walking paths of anything that might make someone trip, such as rocks or tools.  Regularly check to see if handrails are loose or broken. Make sure that both sides of any steps have handrails.  Any raised decks and porches should have guardrails on the edges.  Have any leaves, snow, or ice cleared regularly.  Use sand or salt on walking paths during winter.  Clean up any spills in your garage right away. This includes oil or grease spills. What can I do in the bathroom?  Use night lights.  Install grab bars by the toilet and in the tub and shower. Do not use towel bars as grab bars.  Use non-skid mats or decals in the tub or shower.  If you need to sit down in the shower, use a plastic, non-slip stool.  Keep the floor dry. Clean up any water that spills on the floor as soon as it happens.  Remove soap buildup in the tub or shower regularly.  Attach bath mats securely with double-sided non-slip rug tape.  Do not have throw rugs and other  things on the floor that can make you trip. What can I do in the bedroom?  Use night lights.  Make sure that you have a light by your bed that is easy to reach.  Do not use any sheets or blankets that are too big for your bed. They should not hang down onto the floor.  Have a firm chair that has side arms. You can use this for support while you get dressed.  Do not have throw rugs and other things on the floor that can make you trip. What can I do in the kitchen?  Clean up any spills right away.  Avoid walking on wet floors.  Keep items that you use a lot in easy-to-reach places.  If you need to reach something above you, use a strong step stool that has a grab bar.  Keep electrical cords out of the way.  Do not use floor polish or wax that makes floors slippery. If you must use wax, use non-skid floor wax.  Do not have throw rugs and other things on the floor that can make you trip. What can I do with my stairs?  Do not leave  any items on the stairs.  Make sure that there are handrails on both sides of the stairs and use them. Fix handrails that are broken or loose. Make sure that handrails are as long as the stairways.  Check any carpeting to make sure that it is firmly attached to the stairs. Fix any carpet that is loose or worn.  Avoid having throw rugs at the top or bottom of the stairs. If you do have throw rugs, attach them to the floor with carpet tape.  Make sure that you have a light switch at the top of the stairs and the bottom of the stairs. If you do not have them, ask someone to add them for you. What else can I do to help prevent falls?  Wear shoes that:  Do not have high heels.  Have rubber bottoms.  Are comfortable and fit you well.  Are closed at the toe. Do not wear sandals.  If you use a stepladder:  Make sure that it is fully opened. Do not climb a closed stepladder.  Make sure that both sides of the stepladder are locked into place.  Ask  someone to hold it for you, if possible.  Clearly mark and make sure that you can see:  Any grab bars or handrails.  First and last steps.  Where the edge of each step is.  Use tools that help you move around (mobility aids) if they are needed. These include:  Canes.  Walkers.  Scooters.  Crutches.  Turn on the lights when you go into a dark area. Replace any light bulbs as soon as they burn out.  Set up your furniture so you have a clear path. Avoid moving your furniture around.  If any of your floors are uneven, fix them.  If there are any pets around you, be aware of where they are.  Review your medicines with your doctor. Some medicines can make you feel dizzy. This can increase your chance of falling. Ask your doctor what other things that you can do to help prevent falls. This information is not intended to replace advice given to you by your health care provider. Make sure you discuss any questions you have with your health care provider. Document Released: 04/02/2009 Document Revised: 11/12/2015 Document Reviewed: 07/11/2014 Elsevier Interactive Patient Education  2017 [employer].

## 2018-12-19 NOTE — Progress Notes (Signed)
Subjective:   Richard Henry is a 73 y.o. male who presents for Medicare Annual/Subsequent preventive examination.  This visit is being conducted via phone call  - after an attmept to do on video chat - due to the COVID-19 pandemic. This patient has given me verbal consent via phone to conduct this visit, patient states they are participating from their home address. Some vital signs may be absent or patient reported.   Patient identification: identified by name, DOB, and current address.    Review of Systems:   Cardiac Risk Factors include: advanced age (>43men, >86 women);male gender;hypertension     Objective:    Vitals: There were no vitals taken for this visit.  There is no height or weight on file to calculate BMI.  Advanced Directives 11/08/2017 07/19/2017 02/28/2017 07/18/2016 07/14/2015 07/14/2015  Does Patient Have a Medical Advance Directive? No Yes Yes Yes Yes Yes  Type of Advance Directive - [place];Living will Living will;Healthcare Power of [place];Living will [place];Living will Living will;Healthcare Power of Attorney  Does patient want to make changes to medical advance directive? - - - - No - Patient declined -  Copy of [place] in Chart? - Yes No - copy requested No - copy requested Yes -  Would patient like information on creating a medical advance directive? No - Patient declined - - - - -    Tobacco Social History   Tobacco Use  Smoking Status Current Some Day Smoker   Packs/day: 0.25   Years: 37.00   Pack years: 9.25   Types: Cigarettes  Smokeless Tobacco Never Used     Ready to quit: Yes Counseling given: Yes   Clinical Intake:  Pre-visit preparation completed: Yes  Pain : No/denies pain     Nutritional Risks: None Diabetes: No  How often do you need to have someone help you when you read instructions, pamphlets, or other written materials from your  doctor or pharmacy?: 1 - Never What is the last grade level you completed in school?: some college  Interpreter Needed?: No  Information entered by :: Shazia Mitchener,LPN  Past Medical History:  Diagnosis Date   Cancer ([place])    prostate   Glaucoma    Hyperlipidemia    Hypertension    Personal history of tobacco use, presenting hazards to health 08/06/2015   Tobacco use disorder    Past Surgical History:  Procedure Laterality Date   COLONOSCOPY WITH PROPOFOL N/A 09/05/2017   Procedure: COLONOSCOPY WITH PROPOFOL;  Surgeon: Lucilla Lame, MD;  Location: [facility] ENDOSCOPY;  Service: Endoscopy;  Laterality: N/A;   EYE SURGERY     PROSTATE SURGERY  06/28/12   RETINAL DETACHMENT SURGERY     Family History  Problem Relation Age of Onset   Hypertension Mother    Osteoporosis Mother    Cancer Father        lung   Hypertension Father    Social History   Socioeconomic History   Marital status: Married    Spouse name: Not on file   Number of children: Not on file   Years of education: Not on file   Highest education level: Some college, no degree  Occupational History   Occupation: retired  Scientist, product/process development strain: Not hard at all   Food insecurity    Worry: Never true    Inability: Never true   Transportation needs    Medical: No  Non-medical: No  Tobacco Use   Smoking status: Current Some Day Smoker    Packs/day: 0.25    Years: 37.00    Pack years: 9.25    Types: Cigarettes   Smokeless tobacco: Never Used  Substance and Sexual Activity   Alcohol use: Yes    Alcohol/week: 2.0 standard drinks    Types: 2 Cans of beer per week   Drug use: No   Sexual activity: Yes  Lifestyle   Physical activity    Days per week: 1 day    Minutes per session: 60 min   Stress: Not at all  Relationships   Social connections    Talks on phone: More than three times a week    Gets together: More than three times a week    Attends religious  service: Never    Active member of club or organization: Yes    Attends meetings of clubs or organizations: More than 4 times per year    Relationship status: Married  Other Topics Concern   Not on file  Social History Narrative   Golfing once a week     Outpatient Encounter Medications as of 12/19/2018  Medication Sig   atorvastatin (LIPITOR) 40 MG tablet Take 1 tablet (40 mg total) by mouth daily.   metoprolol tartrate (LOPRESSOR) 25 MG tablet Take 25 mg by mouth 2 (two) times daily.   Multiple Vitamin (MULTIVITAMIN) tablet Take 1 tablet by mouth daily.   timolol (TIMOPTIC) 0.5 % ophthalmic solution Administer 1 drop to both eyes daily.    No facility-administered encounter medications on file as of 12/19/2018.     Activities of Daily Living In your present state of health, do you have any difficulty performing the following activities: 12/19/2018 12/05/2018  Hearing? N N  Vision? N N  Difficulty concentrating or making decisions? N N  Walking or climbing stairs? N N  Dressing or bathing? N N  Doing errands, shopping? N N  Preparing Food and eating ? N -  Using the Toilet? N -  In the past six months, have you accidently leaked urine? N -  Do you have problems with loss of bowel control? N -  Managing your Medications? N -  Managing your Finances? N -  Housekeeping or managing your Housekeeping? N -  Some recent data might be hidden    Patient Care Team: Volney American, PA-C as PCP - General (Family Medicine) Kathrine Haddock, NP as PCP - Family Medicine (Nurse Practitioner) Estill Cotta, MD (Ophthalmology)   Assessment:   This is a routine wellness examination for Richard Henry.  Exercise Activities and Dietary recommendations Current Exercise Habits: The patient does not participate in regular exercise at present(golf once a week), Exercise limited by: None identified  Goals     Quit Smoking       Fall Risk: Fall Risk  12/19/2018 12/05/2018 07/19/2017  07/18/2016 07/14/2015  Falls in the past year? 0 0 No No No  Number falls in past yr: - 0 - - -  Injury with Fall? - 0 - - -    FALL RISK PREVENTION PERTAINING TO THE HOME:  Any stairs in or around the home? Yes  If so, are there any without handrails? No   Home free of loose throw rugs in walkways, pet beds, electrical cords, etc? Yes  Adequate lighting in your home to reduce risk of falls? Yes   ASSISTIVE DEVICES UTILIZED TO PREVENT FALLS:  Life alert? No  Use of  a cane, walker or w/c? No  Grab bars in the bathroom? Yes  Shower chair or bench in shower? No  Elevated toilet seat or a handicapped toilet? No   TIMED UP AND GO:  Unable to perfrom   Depression Screen PHQ 2/9 Scores 12/19/2018 12/05/2018 07/19/2017 07/18/2016  PHQ - 2 Score 0 0 0 1  PHQ- 9 Score - 2 - 3    Cognitive Function     6CIT Screen 12/19/2018 07/19/2017  What Year? 0 points 0 points  What month? 0 points 0 points  What time? 0 points 0 points  Count back from 20 0 points 0 points  Months in reverse 0 points 0 points  Repeat phrase 0 points 0 points  Total Score 0 0    Immunization History  Administered Date(s) Administered   Influenza, High Dose Seasonal PF 05/17/2017, 05/25/2018   Influenza,inj,Quad PF,6+ Mos 07/14/2015   Pneumococcal Conjugate-13 02/03/2014   Pneumococcal Polysaccharide-23 03/14/2012   Tdap 01/25/2010   Zoster 07/14/2015    Qualifies for Shingles Vaccine? Yes  Zostavax completed 06/2015. Due for Shingrix. Education has been provided regarding the importance of this vaccine. Pt has been advised to call insurance company to determine out of pocket expense. Advised may also receive vaccine at local pharmacy or Health Dept. Verbalized acceptance and understanding.  Tdap: up to date   Flu Vaccine: up to date   Pneumococcal Vaccine: up to date   Screening Tests Health Maintenance  Topic Date Due   INFLUENZA VACCINE  01/19/2019   TETANUS/TDAP  01/26/2020   COLONOSCOPY   09/06/2027   Hepatitis C Screening  Completed   PNA vac Low Risk Adult  Completed   Cancer Screenings:  Colorectal Screening: Completed 08/26/2017. Repeat every 10 years  Lung Cancer Screening: (Low Dose CT Chest recommended if Age 3-80 years, 30 pack-year currently smoking OR have quit w/in 15years.) does qualify.  Completed 09/07/2017. Ordered for this year.    Additional Screening:  Hepatitis C Screening: does qualify; Completed 07/14/2015  Vision Screening: Recommended annual ophthalmology exams for early detection of glaucoma and other disorders of the eye. Is the patient up to date with their annual eye exam?  Yes  Who is the provider or what is the name of the office in which the pt attends annual eye exams? [facility]    Dental Screening: Recommended annual dental exams for proper oral hygiene  Community Resource Referral:  CRR required this visit?  No        Plan:  I have personally reviewed and addressed the Medicare Annual Wellness questionnaire and have noted the following in the patients chart:  A. Medical and social history B. Use of alcohol, tobacco or illicit drugs  C. Current medications and supplements D. Functional ability and status E.  Nutritional status F.  Physical activity G. Advance directives H. List of other physicians I.  Hospitalizations, surgeries, and ER visits in previous 12 months J.  [place] such as hearing and vision if needed, cognitive and depression L. Referrals and appointments   In addition, I have reviewed and discussed with patient certain preventive protocols, quality metrics, and best practice recommendations. A written personalized care plan for preventive services as well as general preventive health recommendations were provided to patient.   Signed,   Bevelyn Ngo, LPN  10/22/6566 Nurse Health Advisor   Nurse Notes: none

## 2019-01-07 ENCOUNTER — Telehealth: Payer: Self-pay | Admitting: *Deleted

## 2019-01-07 DIAGNOSIS — Z122 Encounter for screening for malignant neoplasm of respiratory organs: Secondary | ICD-10-CM

## 2019-01-07 DIAGNOSIS — Z87891 Personal history of nicotine dependence: Secondary | ICD-10-CM

## 2019-01-07 NOTE — Telephone Encounter (Signed)
Patient has been notified that annual lung cancer screening low dose CT scan is due currently or will be in near future. Confirmed that patient is within the age range of 55-77, and asymptomatic, (no signs or symptoms of lung cancer). Patient denies illness that would prevent curative treatment for lung cancer if found. Verified smoking history, (current, 37 pack year). The shared decision making visit was done 08/07/15. Patient is agreeable for CT scan being scheduled.

## 2019-01-16 ENCOUNTER — Other Ambulatory Visit: Payer: Self-pay

## 2019-01-16 ENCOUNTER — Ambulatory Visit
Admission: RE | Admit: 2019-01-16 | Discharge: 2019-01-16 | Disposition: A | Discharge status: Home / Self Care | Payer: Medicare Other | Source: Ambulatory Visit | Attending: Oncology | Admitting: Oncology

## 2019-01-16 DIAGNOSIS — Z122 Encounter for screening for malignant neoplasm of respiratory organs: Secondary | ICD-10-CM

## 2019-01-16 DIAGNOSIS — Z87891 Personal history of nicotine dependence: Secondary | ICD-10-CM | POA: Insufficient documentation

## 2019-01-18 ENCOUNTER — Encounter: Payer: Self-pay | Admitting: *Deleted

## 2019-03-17 ENCOUNTER — Other Ambulatory Visit: Payer: Self-pay | Admitting: Family Medicine

## 2019-06-05 ENCOUNTER — Telehealth: Payer: Self-pay | Admitting: Family Medicine

## 2019-06-05 NOTE — Telephone Encounter (Signed)
Called pt to see if he could do virtual, pt is really wanting to come into office to be seen, I explained the reason why and he feels that he won't be seen properly by doing virtual. Pt has means to do virtual but doesn't want to. Please advise.

## 2019-06-05 NOTE — Telephone Encounter (Signed)
Called pt and let him know of Rachel's response, he verbalized understanding

## 2019-06-05 NOTE — Telephone Encounter (Signed)
Absolutely can be in office as long as he passes screening

## 2019-06-06 ENCOUNTER — Ambulatory Visit: Payer: Medicare Other | Admitting: Family Medicine

## 2019-06-10 ENCOUNTER — Other Ambulatory Visit: Payer: Self-pay

## 2019-06-10 ENCOUNTER — Encounter: Payer: Self-pay | Admitting: Family Medicine

## 2019-06-10 ENCOUNTER — Ambulatory Visit (INDEPENDENT_AMBULATORY_CARE_PROVIDER_SITE_OTHER): Payer: Medicare Other | Admitting: Family Medicine

## 2019-06-10 VITALS — BP 126/77 | HR 73 | Temp 98.1°F | Ht 70.0 in | Wt 184.0 lb

## 2019-06-10 DIAGNOSIS — I10 Essential (primary) hypertension: Secondary | ICD-10-CM | POA: Diagnosis not present

## 2019-06-10 DIAGNOSIS — Z8546 Personal history of malignant neoplasm of prostate: Secondary | ICD-10-CM | POA: Diagnosis not present

## 2019-06-10 DIAGNOSIS — E782 Mixed hyperlipidemia: Secondary | ICD-10-CM

## 2019-06-10 DIAGNOSIS — J449 Chronic obstructive pulmonary disease, unspecified: Secondary | ICD-10-CM

## 2019-06-10 MED ORDER — ATORVASTATIN CALCIUM 40 MG PO TABS: 40.0000 mg | ORAL_TABLET | Freq: Every day | ORAL | 0 refills | Status: DC

## 2019-06-10 NOTE — Progress Notes (Signed)
BP 126/77   Pulse 73   Temp 98.1 F (36.7 C) (Oral)   Ht 5\' 10"  (1.778 m)   Wt 184 lb (83.5 kg)   SpO2 99%   BMI 26.40 kg/m    Subjective:    Patient ID: Richard Henry, male    DOB: July 09, 1945, 73 y.o.   MRN: [id number]  HPI: Richard Henry is a 73 y.o. male  Chief Complaint  Patient presents with  . Hypertension  . Hyperlipidemia   Patient presenting today for 6 month f/u chronic conditions.   COPD - noted on CT scan. Asymptomatic, not requiring inhalers.   HTN - checking BPs occasionally, has not checked for about a month now but typically getting 120s/70s. Denies CP, SOB, HAs, dizziness. Taking medicines faithfully without side effects.   HLD - Taking the lipitor without myalgias. Denies CP, SOB, claudication. The 10-year ASCVD risk score Mikey Bussing DC Brooke Bonito., et al., 2013) is: 24.1%   Values used to calculate the score:     Age: 14 years     Sex: Male     Is Non-Hispanic African American: No     Diabetic: No     Tobacco smoker: Yes     Systolic Blood Pressure: 123XX123 mmHg     Is BP treated: Yes     HDL Cholesterol: 61 mg/dL     Total Cholesterol: 188 mg/dL  Hx of prostate cancer s/p prostatectomy. Released by Urology, gets annual PSAs checked which have been stable. No new sxs.   Relevant past medical, surgical, family and social history reviewed and updated as indicated. Interim medical history since our last visit reviewed. Allergies and medications reviewed and updated.  Review of Systems  Per HPI unless specifically indicated above     Objective:    BP 126/77   Pulse 73   Temp 98.1 F (36.7 C) (Oral)   Ht 5\' 10"  (1.778 m)   Wt 184 lb (83.5 kg)   SpO2 99%   BMI 26.40 kg/m   Wt Readings from Last 3 Encounters:  06/10/19 184 lb (83.5 kg)  01/16/19 178 lb (80.7 kg)  12/05/18 186 lb (84.4 kg)    Physical Exam Vitals and nursing note reviewed.  Constitutional:      Appearance: Normal appearance.  HENT:     Head: Atraumatic.  Eyes:     Extraocular  Movements: Extraocular movements intact.     Conjunctiva/sclera: Conjunctivae normal.  Cardiovascular:     Rate and Rhythm: Normal rate and regular rhythm.  Pulmonary:     Effort: Pulmonary effort is normal.     Breath sounds: Normal breath sounds.  Musculoskeletal:        General: Normal range of motion.     Cervical back: Normal range of motion and neck supple.  Skin:    General: Skin is warm and dry.  Neurological:     General: No focal deficit present.     Mental Status: He is oriented to person, place, and time.  Psychiatric:        Mood and Affect: Mood normal.        Thought Content: Thought content normal.        Judgment: Judgment normal.     Results for orders placed or performed in visit on 06/10/19  Comprehensive metabolic panel  Result Value Ref Range   Glucose 90 65 - 99 mg/dL   BUN 16 8 - 27 mg/dL   Creatinine, Ser 1.15 0.76 - 1.27 mg/dL  GFR calc non Af Amer 63 >59 mL/min/1.73   GFR calc Af Amer 73 >59 mL/min/1.73   BUN/Creatinine Ratio 14 10 - 24   Sodium 140 134 - 144 mmol/L   Potassium 4.9 3.5 - 5.2 mmol/L   Chloride 103 96 - 106 mmol/L   CO2 24 20 - 29 mmol/L   Calcium 9.6 8.6 - 10.2 mg/dL   Total Protein 6.8 6.0 - 8.5 g/dL   Albumin 4.5 3.7 - 4.7 g/dL   Globulin, Total 2.3 1.5 - 4.5 g/dL   Albumin/Globulin Ratio 2.0 1.2 - 2.2   Bilirubin Total 0.6 0.0 - 1.2 mg/dL   Alkaline Phosphatase 58 39 - 117 IU/L   AST 23 0 - 40 IU/L   ALT 15 0 - 44 IU/L  Lipid Panel w/o Chol/HDL Ratio out  Result Value Ref Range   Cholesterol, Total 188 100 - 199 mg/dL   Triglycerides 92 0 - 149 mg/dL   HDL 61 >39 mg/dL   VLDL Cholesterol Cal 17 5 - 40 mg/dL   LDL Chol Calc (NIH) 110 (H) 0 - 99 mg/dL      Assessment & Plan:   Problem List Items Addressed This Visit      Cardiovascular and Mediastinum   Hypertension - Primary    BPs stable and under good control. Continue current regimen      Relevant Medications   atorvastatin (LIPITOR) 40 MG tablet   Other  Relevant Orders   Comprehensive metabolic panel (Completed)     Respiratory   COPD (chronic obstructive pulmonary disease) (HCC)    Stable without inhalers. Continue current regimen        Other   Hyperlipidemia    Recheck lipids, adjust as needed. Continue current regimen      Relevant Medications   atorvastatin (LIPITOR) 40 MG tablet   Other Relevant Orders   Lipid Panel w/o Chol/HDL Ratio out (Completed)   History of prostate cancer    With stable PSA testing annually. Continue to monitor          Follow up plan: Return in about 6 months (around 12/09/2019) for CPE.

## 2019-06-11 LAB — LIPID PANEL W/O CHOL/HDL RATIO
Cholesterol, Total: 188 mg/dL (ref 100–199)
HDL: 61 mg/dL (ref >39)
LDL Chol Calc (NIH): 110 mg/dL — ABNORMAL HIGH (ref 0–99)
Triglycerides: 92 mg/dL (ref 0–149)
VLDL Cholesterol Cal: 17 mg/dL (ref 5–40)

## 2019-06-11 LAB — COMPREHENSIVE METABOLIC PANEL
ALT: 15 IU/L (ref 0–44)
AST: 23 IU/L (ref 0–40)
Albumin/Globulin Ratio: 2 (ref 1.2–2.2)
Albumin: 4.5 g/dL (ref 3.7–4.7)
Alkaline Phosphatase: 58 IU/L (ref 39–117)
BUN/Creatinine Ratio: 14 (ref 10–24)
BUN: 16 mg/dL (ref 8–27)
Bilirubin Total: 0.6 mg/dL (ref 0.0–1.2)
CO2: 24 mmol/L (ref 20–29)
Calcium: 9.6 mg/dL (ref 8.6–10.2)
Chloride: 103 mmol/L (ref 96–106)
Creatinine, Ser: 1.15 mg/dL (ref 0.76–1.27)
GFR calc Af Amer: 73 mL/min/{1.73_m2} (ref >59)
GFR calc non Af Amer: 63 mL/min/{1.73_m2} (ref >59)
Globulin, Total: 2.3 g/dL (ref 1.5–4.5)
Glucose: 90 mg/dL (ref 65–99)
Potassium: 4.9 mmol/L (ref 3.5–5.2)
Sodium: 140 mmol/L (ref 134–144)
Total Protein: 6.8 g/dL (ref 6.0–8.5)

## 2019-06-13 NOTE — Assessment & Plan Note (Signed)
With stable PSA testing annually. Continue to monitor

## 2019-06-13 NOTE — Assessment & Plan Note (Signed)
Recheck lipids, adjust as needed. Continue current regimen 

## 2019-06-13 NOTE — Assessment & Plan Note (Signed)
Stable without inhalers. Continue current regimen

## 2019-06-13 NOTE — Assessment & Plan Note (Signed)
BPs stable and under good control. Continue current regimen 

## 2019-12-09 ENCOUNTER — Ambulatory Visit (INDEPENDENT_AMBULATORY_CARE_PROVIDER_SITE_OTHER): Payer: Medicare Other | Admitting: Family Medicine

## 2019-12-09 ENCOUNTER — Other Ambulatory Visit: Payer: Self-pay

## 2019-12-09 ENCOUNTER — Encounter: Payer: Self-pay | Admitting: Family Medicine

## 2019-12-09 VITALS — BP 131/85 | HR 53 | Temp 97.9°F | Ht 70.5 in | Wt 187.0 lb

## 2019-12-09 DIAGNOSIS — E782 Mixed hyperlipidemia: Secondary | ICD-10-CM | POA: Diagnosis not present

## 2019-12-09 DIAGNOSIS — Z0000 Encounter for general adult medical examination without abnormal findings: Secondary | ICD-10-CM | POA: Diagnosis not present

## 2019-12-09 DIAGNOSIS — J449 Chronic obstructive pulmonary disease, unspecified: Secondary | ICD-10-CM | POA: Diagnosis not present

## 2019-12-09 DIAGNOSIS — I10 Essential (primary) hypertension: Secondary | ICD-10-CM | POA: Diagnosis not present

## 2019-12-09 DIAGNOSIS — Z8546 Personal history of malignant neoplasm of prostate: Secondary | ICD-10-CM | POA: Diagnosis not present

## 2019-12-09 LAB — UA/M W/RFLX CULTURE, ROUTINE
Bilirubin, UA: NEGATIVE
Glucose, UA: NEGATIVE
Ketones, UA: NEGATIVE
Leukocytes,UA: NEGATIVE
Nitrite, UA: NEGATIVE
Protein,UA: NEGATIVE
RBC, UA: NEGATIVE
Specific Gravity, UA: 1.02 (ref 1.005–1.030)
Urobilinogen, Ur: 0.2 mg/dL (ref 0.2–1.0)
pH, UA: 5.5 (ref 5.0–7.5)

## 2019-12-09 MED ORDER — ATORVASTATIN CALCIUM 40 MG PO TABS: 40.0000 mg | ORAL_TABLET | Freq: Every day | ORAL | 1 refills | Status: DC

## 2019-12-09 NOTE — Progress Notes (Signed)
BP 131/85   Pulse (!) 53   Temp 97.9 F (36.6 C) (Oral)   Ht 5' 10.5" (1.791 m)   Wt 187 lb (84.8 kg)   SpO2 97%   BMI 26.45 kg/m    Subjective:    Patient ID: Richard Henry, male    DOB: 02/16/1946, 74 y.o.   MRN: [id number]  HPI: Luisantonio Adinolfi is a 74 y.o. male presenting on 12/09/2019 for comprehensive medical examination. Current medical complaints include:see below  HTN - Metoprolol tolerated well, taking daily. Does not closely follow home BPs. Denies CP, SOB, HAs, dizziness.   COPD - Stable off inhalers, no recent exacerbations.   HLD - on lipitor, tolerating well without myalgias. Denies CP, SOB, claudication. Tries to watch diet, stays active.   Hx of prostate cancer - no longer followed by Urology, s/p prostatectomy. Stable PSA readings annually the past few years.   He currently lives with: Interim Problems from his last visit: no  Depression Screen done today and results listed below:  Depression screen [facility] 2/9 12/09/2019 12/19/2018 12/05/2018 07/19/2017 07/18/2016  Decreased Interest 0 0 0 0 0  Down, Depressed, Hopeless 0 0 0 0 1  PHQ - 2 Score 0 0 0 0 1  Altered sleeping 0 - 1 - 1  Tired, decreased energy 1 - 1 - 1  Change in appetite 0 - 0 - 0  Feeling bad or failure about yourself  0 - 0 - 0  Trouble concentrating 0 - 0 - 0  Moving slowly or fidgety/restless 0 - 0 - 0  Suicidal thoughts 0 - 0 - 0  PHQ-9 Score 1 - 2 - 3    The patient does not have a history of falls. I did complete a risk assessment for falls. A plan of care for falls was documented.   Past Medical History:  Past Medical History:  Diagnosis Date  . Cancer [facility])    prostate  . Glaucoma   . Hyperlipidemia   . Hypertension   . Personal history of tobacco use, presenting hazards to health 08/06/2015  . Tobacco use disorder     Surgical History:  Past Surgical History:  Procedure Laterality Date  . COLONOSCOPY WITH PROPOFOL N/A 09/05/2017   Procedure: COLONOSCOPY WITH PROPOFOL;  Surgeon:  Lucilla Lame, MD;  Location: [facility] ENDOSCOPY;  Service: Endoscopy;  Laterality: N/A;  . EYE SURGERY    . PROSTATE SURGERY  06/28/12  . RETINAL DETACHMENT SURGERY      Medications:  Current Outpatient Medications on File Prior to Visit  Medication Sig  . metoprolol tartrate (LOPRESSOR) 25 MG tablet Take 25 mg by mouth 2 (two) times daily.  . Multiple Vitamin (MULTIVITAMIN) tablet Take 1 tablet by mouth daily.   No current facility-administered medications on file prior to visit.    Allergies:  No Known Allergies  Social History:  Social History   Socioeconomic History  . Marital status: Married    Spouse name: Not on file  . Number of children: Not on file  . Years of education: Not on file  . Highest education level: Some college, no degree  Occupational History  . Occupation: retired  Tobacco Use  . Smoking status: Current Some Day Smoker    Packs/day: 0.25    Years: 37.00    Pack years: 9.25    Types: Cigarettes  . Smokeless tobacco: Never Used  Vaping Use  . Vaping Use: Never used  Substance and Sexual Activity  . Alcohol use:  Yes    Alcohol/week: 2.0 standard drinks    Types: 2 Cans of beer per week  . Drug use: No  . Sexual activity: Yes  Other Topics Concern  . Not on file  Social History Narrative   Golfing once a week    Social Determinants of Health   Financial Resource Strain:   . Difficulty of Paying Living Expenses:   Food Insecurity:   . Worried About Charity fundraiser in the Last Year:   . Arboriculturist in the Last Year:   Transportation Needs:   . Film/video editor (Medical):   Marland Kitchen Lack of Transportation (Non-Medical):   Physical Activity: Insufficiently Active  . Days of Exercise per Week: 1 day  . Minutes of Exercise per Session: 60 min  Stress:   . Feeling of Stress :   Social Connections:   . Frequency of Communication with Friends and Family:   . Frequency of Social Gatherings with Friends and Family:   . Attends Religious  Services:   . Active Member of Clubs or Organizations:   . Attends Archivist Meetings:   Marland Kitchen Marital Status:   Intimate Partner Violence:   . Fear of Current or Ex-Partner:   . Emotionally Abused:   Marland Kitchen Physically Abused:   . Sexually Abused:    Social History   Tobacco Use  Smoking Status Current Some Day Smoker  . Packs/day: 0.25  . Years: 37.00  . Pack years: 9.25  . Types: Cigarettes  Smokeless Tobacco Never Used   Social History   Substance and Sexual Activity  Alcohol Use Yes  . Alcohol/week: 2.0 standard drinks  . Types: 2 Cans of beer per week    Family History:  Family History  Problem Relation Age of Onset  . Hypertension Mother   . Osteoporosis Mother   . Cancer Father        lung  . Hypertension Father     Past medical history, surgical history, medications, allergies, family history and social history reviewed with patient today and changes made to appropriate areas of the chart.   Review of Systems - General ROS: negative Psychological ROS: negative Ophthalmic ROS: negative ENT ROS: negative Allergy and Immunology ROS: negative Hematological and Lymphatic ROS: negative Endocrine ROS: negative Breast ROS: negative for breast lumps Respiratory ROS: no cough, shortness of breath, or wheezing Cardiovascular ROS: no chest pain or dyspnea on exertion Gastrointestinal ROS: no abdominal pain, change in bowel habits, or black or bloody stools Genito-Urinary ROS: no dysuria, trouble voiding, or hematuria Musculoskeletal ROS: negative Neurological ROS: no TIA or stroke symptoms Dermatological ROS: negative All other ROS negative except what is listed above and in the HPI.      Objective:    BP 131/85   Pulse (!) 53   Temp 97.9 F (36.6 C) (Oral)   Ht 5' 10.5" (1.791 m)   Wt 187 lb (84.8 kg)   SpO2 97%   BMI 26.45 kg/m   Wt Readings from Last 3 Encounters:  12/09/19 187 lb (84.8 kg)  06/10/19 184 lb (83.5 kg)  01/16/19 178 lb (80.7 kg)      Physical Exam Vitals and nursing note reviewed.  Constitutional:      General: He is not in acute distress.    Appearance: He is well-developed.  HENT:     Head: Atraumatic.     Right Ear: Tympanic membrane and external ear normal.     Left Ear: Tympanic  membrane and external ear normal.     Nose: Nose normal.     Mouth/Throat:     Mouth: Mucous membranes are moist.     Pharynx: Oropharynx is clear.  Eyes:     General: No scleral icterus.    Conjunctiva/sclera: Conjunctivae normal.     Pupils: Pupils are equal, round, and reactive to light.  Cardiovascular:     Rate and Rhythm: Normal rate and regular rhythm.     Heart sounds: Normal heart sounds. No murmur heard.   Pulmonary:     Effort: Pulmonary effort is normal. No respiratory distress.     Breath sounds: Normal breath sounds.  Abdominal:     General: Bowel sounds are normal. There is no distension.     Palpations: Abdomen is soft. There is no mass.     Tenderness: There is no abdominal tenderness. There is no guarding.  Genitourinary:    Comments: GU exam declined with shared decision making Musculoskeletal:        General: No tenderness. Normal range of motion.     Cervical back: Normal range of motion and neck supple.  Skin:    General: Skin is warm and dry.     Findings: No rash.  Neurological:     General: No focal deficit present.     Mental Status: He is alert and oriented to person, place, and time.     Deep Tendon Reflexes: Reflexes are normal and symmetric.  Psychiatric:        Mood and Affect: Mood normal.        Behavior: Behavior normal.        Thought Content: Thought content normal.        Judgment: Judgment normal.     Results for orders placed or performed in visit on 12/09/19  CBC with Differential/Platelet  Result Value Ref Range   WBC 6.3 3.4 - 10.8 x10E3/uL   RBC 4.61 4.14 - 5.80 x10E6/uL   Hemoglobin 14.9 13.0 - 17.7 g/dL   Hematocrit 44.1 37.5 - 51.0 %   MCV 96 79 - 97 fL   MCH  32.3 26.6 - 33.0 pg   MCHC 33.8 31 - 35 g/dL   RDW 13.1 11.6 - 15.4 %   Platelets 196 150 - 450 x10E3/uL   Neutrophils 48 Not Estab. %   Lymphs 31 Not Estab. %   Monocytes 12 Not Estab. %   Eos 8 Not Estab. %   Basos 1 Not Estab. %   Neutrophils Absolute 3.0 1 - 7 x10E3/uL   Lymphocytes Absolute 1.9 0 - 3 x10E3/uL   Monocytes Absolute 0.8 0 - 0 x10E3/uL   EOS (ABSOLUTE) 0.5 (H) 0.0 - 0.4 x10E3/uL   Basophils Absolute 0.1 0 - 0 x10E3/uL   Immature Granulocytes 0 Not Estab. %   Immature Grans (Abs) 0.0 0.0 - 0.1 x10E3/uL  Comprehensive metabolic panel  Result Value Ref Range   Glucose 95 65 - 99 mg/dL   BUN 13 8 - 27 mg/dL   Creatinine, Ser 1.11 0.76 - 1.27 mg/dL   GFR calc non Af Amer 66 >59 mL/min/1.73   GFR calc Af Amer 76 >59 mL/min/1.73   BUN/Creatinine Ratio 12 10 - 24   Sodium 139 134 - 144 mmol/L   Potassium 5.0 3.5 - 5.2 mmol/L   Chloride 104 96 - 106 mmol/L   CO2 25 20 - 29 mmol/L   Calcium 9.7 8.6 - 10.2 mg/dL   Total Protein 6.6 6.0 -  8.5 g/dL   Albumin 4.1 3.7 - 4.7 g/dL   Globulin, Total 2.5 1.5 - 4.5 g/dL   Albumin/Globulin Ratio 1.6 1.2 - 2.2   Bilirubin Total 0.3 0.0 - 1.2 mg/dL   Alkaline Phosphatase 49 48 - 121 IU/L   AST 19 0 - 40 IU/L   ALT 16 0 - 44 IU/L  Lipid Panel w/o Chol/HDL Ratio  Result Value Ref Range   Cholesterol, Total 167 100 - 199 mg/dL   Triglycerides 56 0 - 149 mg/dL   HDL 60 >39 mg/dL   VLDL Cholesterol Cal 11 5 - 40 mg/dL   LDL Chol Calc (NIH) 96 0 - 99 mg/dL  PSA  Result Value Ref Range   Prostate Specific Ag, Serum 0.2 0.0 - 4.0 ng/mL  UA/M w/rflx Culture, Routine   Specimen: Urine   Urine  Result Value Ref Range   Specific Gravity, UA 1.020 1.005 - 1.030   pH, UA 5.5 5.0 - 7.5   Color, UA Yellow Yellow   Appearance Ur Clear Clear   Leukocytes,UA Negative Negative   Protein,UA Negative Negative/Trace   Glucose, UA Negative Negative   Ketones, UA Negative Negative   RBC, UA Negative Negative   Bilirubin, UA Negative  Negative   Urobilinogen, Ur 0.2 0.2 - 1.0 mg/dL   Nitrite, UA Negative Negative      Assessment & Plan:   Problem List Items Addressed This Visit      Cardiovascular and Mediastinum   Hypertension - Primary    BPs stable and WNL, continue current regimen      Relevant Medications   atorvastatin (LIPITOR) 40 MG tablet   Other Relevant Orders   CBC with Differential/Platelet (Completed)   Comprehensive metabolic panel (Completed)   UA/M w/rflx Culture, Routine (Completed)     Respiratory   COPD (chronic obstructive pulmonary disease) (HCC)    Stable off inhalers, continue to monitor         Other   Hyperlipidemia    Stable, recheck lipids and continue current regimen. Lifestyle modifications reviewed      Relevant Medications   atorvastatin (LIPITOR) 40 MG tablet   Other Relevant Orders   Lipid Panel w/o Chol/HDL Ratio (Completed)   History of prostate cancer   Relevant Orders   PSA (Completed)    Other Visit Diagnoses    Annual physical exam           Discussed aspirin prophylaxis for myocardial infarction prevention and decision was it was not indicated  LABORATORY TESTING:  Health maintenance labs ordered today as discussed above.   The natural history of prostate cancer and ongoing controversy regarding screening and potential treatment outcomes of prostate cancer has been discussed with the patient. The meaning of a false positive PSA and a false negative PSA has been discussed. He indicates understanding of the limitations of this screening test and wishes to proceed with screening PSA testing.   IMMUNIZATIONS:   - Tdap: Tetanus vaccination status reviewed: last tetanus booster within 10 years. - Influenza: Up to date - Pneumovax: Up to date - Prevnar: Up to date - HPV: Not applicable - Zostavax vaccine: Up to date  SCREENING: - Colonoscopy: Up to date  Discussed with patient purpose of the colonoscopy is to detect colon cancer at curable  precancerous or early stages   PATIENT COUNSELING:    Sexuality: Discussed sexually transmitted diseases, partner selection, use of condoms, avoidance of unintended pregnancy  and contraceptive alternatives.   Advised to  avoid cigarette smoking.  I discussed with the patient that most people either abstain from alcohol or drink within safe limits (<=14/week and <=4 drinks/occasion for males, <=7/weeks and <= 3 drinks/occasion for females) and that the risk for alcohol disorders and other health effects rises proportionally with the number of drinks per week and how often a drinker exceeds daily limits.  Discussed cessation/primary prevention of drug use and availability of treatment for abuse.   Diet: Encouraged to adjust caloric intake to maintain  or achieve ideal body weight, to reduce intake of dietary saturated fat and total fat, to limit sodium intake by avoiding high sodium foods and not adding table salt, and to maintain adequate dietary potassium and calcium preferably from fresh fruits, vegetables, and low-fat dairy products.    stressed the importance of regular exercise  Injury prevention: Discussed safety belts, safety helmets, smoke detector, smoking near bedding or upholstery.   Dental health: Discussed importance of regular tooth brushing, flossing, and dental visits.   Follow up plan: NEXT PREVENTATIVE PHYSICAL DUE IN 1 YEAR. Return in about 6 months (around 06/09/2020) for 6 month f/u.

## 2019-12-10 LAB — CBC WITH DIFFERENTIAL/PLATELET
Basophils Absolute: 0.1 10*3/uL (ref 0.0–0.2)
Basos: 1 %
EOS (ABSOLUTE): 0.5 10*3/uL — ABNORMAL HIGH (ref 0.0–0.4)
Eos: 8 %
Hematocrit: 44.1 % (ref 37.5–51.0)
Hemoglobin: 14.9 g/dL (ref 13.0–17.7)
Immature Grans (Abs): 0 10*3/uL (ref 0.0–0.1)
Immature Granulocytes: 0 %
Lymphocytes Absolute: 1.9 10*3/uL (ref 0.7–3.1)
Lymphs: 31 %
MCH: 32.3 pg (ref 26.6–33.0)
MCHC: 33.8 g/dL (ref 31.5–35.7)
MCV: 96 fL (ref 79–97)
Monocytes Absolute: 0.8 10*3/uL (ref 0.1–0.9)
Monocytes: 12 %
Neutrophils Absolute: 3 10*3/uL (ref 1.4–7.0)
Neutrophils: 48 %
Platelets: 196 10*3/uL (ref 150–450)
RBC: 4.61 x10E6/uL (ref 4.14–5.80)
RDW: 13.1 % (ref 11.6–15.4)
WBC: 6.3 10*3/uL (ref 3.4–10.8)

## 2019-12-10 LAB — COMPREHENSIVE METABOLIC PANEL
ALT: 16 IU/L (ref 0–44)
AST: 19 IU/L (ref 0–40)
Albumin/Globulin Ratio: 1.6 (ref 1.2–2.2)
Albumin: 4.1 g/dL (ref 3.7–4.7)
Alkaline Phosphatase: 49 IU/L (ref 48–121)
BUN/Creatinine Ratio: 12 (ref 10–24)
BUN: 13 mg/dL (ref 8–27)
Bilirubin Total: 0.3 mg/dL (ref 0.0–1.2)
CO2: 25 mmol/L (ref 20–29)
Calcium: 9.7 mg/dL (ref 8.6–10.2)
Chloride: 104 mmol/L (ref 96–106)
Creatinine, Ser: 1.11 mg/dL (ref 0.76–1.27)
GFR calc Af Amer: 76 mL/min/{1.73_m2} (ref >59)
GFR calc non Af Amer: 66 mL/min/{1.73_m2} (ref >59)
Globulin, Total: 2.5 g/dL (ref 1.5–4.5)
Glucose: 95 mg/dL (ref 65–99)
Potassium: 5 mmol/L (ref 3.5–5.2)
Sodium: 139 mmol/L (ref 134–144)
Total Protein: 6.6 g/dL (ref 6.0–8.5)

## 2019-12-10 LAB — LIPID PANEL W/O CHOL/HDL RATIO
Cholesterol, Total: 167 mg/dL (ref 100–199)
HDL: 60 mg/dL (ref >39)
LDL Chol Calc (NIH): 96 mg/dL (ref 0–99)
Triglycerides: 56 mg/dL (ref 0–149)
VLDL Cholesterol Cal: 11 mg/dL (ref 5–40)

## 2019-12-10 LAB — PSA: Prostate Specific Ag, Serum: 0.2 ng/mL (ref 0.0–4.0)

## 2019-12-10 NOTE — Assessment & Plan Note (Signed)
Stable, recheck lipids and continue current regimen. Lifestyle modifications reviewed

## 2019-12-10 NOTE — Assessment & Plan Note (Signed)
BPs stable and WNL, continue current regimen 

## 2019-12-10 NOTE — Assessment & Plan Note (Signed)
Stable off inhalers, continue to monitor 

## 2019-12-11 ENCOUNTER — Encounter: Payer: Self-pay | Admitting: Family Medicine

## 2020-01-05 ENCOUNTER — Telehealth: Payer: Self-pay

## 2020-01-05 NOTE — Telephone Encounter (Signed)
Contacted patient to schedule annual lung screening scan.  Patient had his last scan in July 2020.  Message left for patient to give Burgess Estelle, lung navigator a call to schedule scan appointment.

## 2020-01-09 ENCOUNTER — Telehealth: Payer: Self-pay

## 2020-01-09 NOTE — Telephone Encounter (Signed)
Contacted patient to schedule annual lung screening CT scan.  Left message for patient to call Burgess Estelle, lung navigator to schedule next scan.

## 2020-01-20 ENCOUNTER — Ambulatory Visit (INDEPENDENT_AMBULATORY_CARE_PROVIDER_SITE_OTHER): Payer: Medicare Other

## 2020-01-20 VITALS — Ht 70.5 in | Wt 174.0 lb

## 2020-01-20 DIAGNOSIS — Z0000 Encounter for general adult medical examination without abnormal findings: Secondary | ICD-10-CM

## 2020-01-20 NOTE — Progress Notes (Signed)
I connected with Richard Henry today by telephone and verified that I am speaking with the correct person using two identifiers. Location patient: home Location provider: work Persons participating in the virtual visit: Haim Hansson, Glenna Durand LPN.   I discussed the limitations, risks, security and privacy concerns of performing an evaluation and management service by telephone and the availability of in person appointments. I also discussed with the patient that there may be a patient responsible charge related to this service. The patient expressed understanding and verbally consented to this telephonic visit.    Interactive audio and video telecommunications were attempted between this provider and patient, however failed, due to patient having technical difficulties OR patient did not have access to video capability.  We continued and completed visit with audio only.    Vital signs may be patient reported or missing.   Subjective:   Richard Henry is a 74 y.o. male who presents for Medicare Annual/Subsequent preventive examination.  Review of Systems     Cardiac Risk Factors include: advanced age (>62men, >46 women);dyslipidemia;hypertension;male gender;smoking/ tobacco exposure;sedentary lifestyle     Objective:    Today's Vitals   01/20/20 1112  Weight: 174 lb (78.9 kg)  Height: 5' 10.5" (1.791 m)   Body mass index is 24.61 kg/m.  Advanced Directives 01/20/2020 11/08/2017 07/19/2017 02/28/2017 07/18/2016 07/14/2015 07/14/2015  Does Patient Have a Medical Advance Directive? Yes No Yes Yes Yes Yes Yes  Type of Paramedic of [place];Living will - [place];Living will Living will;Healthcare Power of [place];Living will [place];Living will Living will;Healthcare Power of Attorney  Does patient want to make changes to medical advance directive? - - - - - No - Patient declined -  Copy of  [place] in Chart? No - copy requested - Yes No - copy requested No - copy requested Yes -  Would patient like information on creating a medical advance directive? - No - Patient declined - - - - -    Current Medications (verified) Outpatient Encounter Medications as of 01/20/2020  Medication Sig  . atorvastatin (LIPITOR) 40 MG tablet Take 1 tablet (40 mg total) by mouth daily.  . metoprolol tartrate (LOPRESSOR) 25 MG tablet Take 25 mg by mouth 2 (two) times daily.  . Multiple Vitamin (MULTIVITAMIN) tablet Take 1 tablet by mouth daily.   No facility-administered encounter medications on file as of 01/20/2020.    Allergies (verified) Patient has no allergy information on record.   History: Past Medical History:  Diagnosis Date  . Cancer [facility])    prostate  . Glaucoma   . Hyperlipidemia   . Hypertension   . Personal history of tobacco use, presenting hazards to health 08/06/2015  . Tobacco use disorder    Past Surgical History:  Procedure Laterality Date  . COLONOSCOPY WITH PROPOFOL N/A 09/05/2017   Procedure: COLONOSCOPY WITH PROPOFOL;  Surgeon: Lucilla Lame, MD;  Location: [facility] ENDOSCOPY;  Service: Endoscopy;  Laterality: N/A;  . EYE SURGERY    . PROSTATE SURGERY  06/28/12  . RETINAL DETACHMENT SURGERY     Family History  Problem Relation Age of Onset  . Hypertension Mother   . Osteoporosis Mother   . Cancer Father        lung  . Hypertension Father    Social History   Socioeconomic History  . Marital status: Married    Spouse name: Not on file  . Number of children: Not on  file  . Years of education: Not on file  . Highest education level: Some college, no degree  Occupational History  . Occupation: retired  Tobacco Use  . Smoking status: Current Some Day Smoker    Packs/day: 0.25    Years: 37.00    Pack years: 9.25    Types: Cigarettes  . Smokeless tobacco: Never Used  Vaping Use  . Vaping Use: Never used  Substance and Sexual Activity  .  Alcohol use: Yes    Alcohol/week: 12.0 standard drinks    Types: 12 Cans of beer per week  . Drug use: No  . Sexual activity: Not Currently  Other Topics Concern  . Not on file  Social History Narrative   Golfing once a week    Social Determinants of Health   Financial Resource Strain: Low Risk   . Difficulty of Paying Living Expenses: Not hard at all  Food Insecurity: No Food Insecurity  . Worried About Charity fundraiser in the Last Year: Never true  . Ran Out of Food in the Last Year: Never true  Transportation Needs: No Transportation Needs  . Lack of Transportation (Medical): No  . Lack of Transportation (Non-Medical): No  Physical Activity: Inactive  . Days of Exercise per Week: 0 days  . Minutes of Exercise per Session: 0 min  Stress: No Stress Concern Present  . Feeling of Stress : Not at all  Social Connections:   . Frequency of Communication with Friends and Family:   . Frequency of Social Gatherings with Friends and Family:   . Attends Religious Services:   . Active Member of Clubs or Organizations:   . Attends Archivist Meetings:   Marland Kitchen Marital Status:     Tobacco Counseling Ready to quit: Yes Counseling given: Not Answered   Clinical Intake:  Pre-visit preparation completed: Yes  Pain : No/denies pain     Nutritional Status: BMI of 19-24  Normal Nutritional Risks: None Diabetes: No  How often do you need to have someone help you when you read instructions, pamphlets, or other written materials from your doctor or pharmacy?: 1 - Never What is the last grade level you completed in school?: 1 yr college  Diabetic? no  Interpreter Needed?: No  Information entered by :: NAllen LPN   Activities of Daily Living In your present state of health, do you have any difficulty performing the following activities: 01/20/2020 12/09/2019  Hearing? N N  Vision? N N  Difficulty concentrating or making decisions? N N  Walking or climbing stairs? N N    Dressing or bathing? N N  Doing errands, shopping? N N  Preparing Food and eating ? N -  Using the Toilet? N -  In the past six months, have you accidently leaked urine? Y -  Do you have problems with loss of bowel control? N -  Managing your Medications? N -  Managing your Finances? N -  Housekeeping or managing your Housekeeping? N -  Some recent data might be hidden    Patient Care Team: Volney American, PA-C as PCP - General (Family Medicine) Kathrine Haddock, NP as PCP - Family Medicine (Nurse Practitioner) Sandra Cockayne, Remo Lipps, MD (Ophthalmology)  Indicate any recent Medical Services you may have received from other than [facility] providers in the past year (date may be approximate).     Assessment:   This is a routine wellness examination for [place].  Hearing/Vision screen  Hearing Screening   125Hz  250Hz   500Hz  1000Hz  2000Hz  3000Hz  4000Hz  6000Hz  8000Hz   Right ear:           Left ear:           Vision Screening Comments: Regular eye exams, [facility]   Dietary issues and exercise activities discussed:    Goals    . Patient Stated     01/20/2020, no goals    . Quit Smoking      Depression Screen PHQ 2/9 Scores 01/20/2020 12/09/2019 12/19/2018 12/05/2018 07/19/2017 07/18/2016 07/14/2015  PHQ - 2 Score 0 0 0 0 0 1 0  PHQ- 9 Score - 1 - 2 - 3 -    Fall Risk Fall Risk  01/20/2020 12/09/2019 12/19/2018 12/05/2018 07/19/2017  Falls in the past year? 0 0 0 0 No  Number falls in past yr: - 0 - 0 -  Injury with Fall? - 0 - 0 -  Risk for fall due to : Medication side effect - - - -  Follow up Falls evaluation completed;Education provided;Falls prevention discussed - - - -    Any stairs in or around the home? Yes  If so, are there any without handrails? Yes  Home free of loose throw rugs in walkways, pet beds, electrical cords, etc? Yes  Adequate lighting in your home to reduce risk of falls? Yes   ASSISTIVE DEVICES UTILIZED TO PREVENT FALLS:  Life alert? No  Use of a  cane, walker or w/c? No  Grab bars in the bathroom? Yes  Shower chair or bench in shower? Yes  Elevated toilet seat or a handicapped toilet? Yes   TIMED UP AND GO:  Was the test performed? No .   Cognitive Function:     6CIT Screen 01/20/2020 12/19/2018 07/19/2017  What Year? 0 points 0 points 0 points  What month? 0 points 0 points 0 points  What time? 0 points 0 points 0 points  Count back from 20 0 points 0 points 0 points  Months in reverse 0 points 0 points 0 points  Repeat phrase 0 points 0 points 0 points  Total Score 0 0 0    Immunizations Immunization History  Administered Date(s) Administered  . Influenza, High Dose Seasonal PF 05/17/2017, 05/25/2018  . Influenza,inj,Quad PF,6+ Mos 07/14/2015  . Influenza,inj,quad, With Preservative 03/27/2019  . Pneumococcal Conjugate-13 02/03/2014  . Pneumococcal Polysaccharide-23 03/14/2012  . Tdap 01/25/2010  . Zoster 07/14/2015    TDAP status: Up to date Flu Vaccine status: due Pneumococcal vaccine status: Up to date Covid-19 vaccine status: Completed vaccines  Qualifies for Shingles Vaccine? Yes   Zostavax completed Yes   Shingrix Completed?: No.    Education has been provided regarding the importance of this vaccine. Patient has been advised to call insurance company to determine out of pocket expense if they have not yet received this vaccine. Advised may also receive vaccine at local pharmacy or Health Dept. Verbalized acceptance and understanding.  Screening Tests Health Maintenance  Topic Date Due  . COVID-19 Vaccine (1) Never done  . INFLUENZA VACCINE  01/19/2020  . TETANUS/TDAP  01/26/2020  . COLONOSCOPY  09/06/2027  . Hepatitis C Screening  Completed  . PNA vac Low Risk Adult  Completed    Health Maintenance  Health Maintenance Due  Topic Date Due  . COVID-19 Vaccine (1) Never done  . INFLUENZA VACCINE  01/19/2020    Colorectal cancer screening: No longer required.   Lung Cancer Screening: (Low Dose  CT Chest recommended if Age 26-80  years, 30 pack-year currently smoking OR have quit w/in 15years.) does not qualify.   Lung Cancer Screening Referral: no  Additional Screening:  Hepatitis C Screening: does qualify; Completed 07/14/2015  Vision Screening: Recommended annual ophthalmology exams for early detection of glaucoma and other disorders of the eye. Is the patient up to date with their annual eye exam?  Yes  Who is the provider or what is the name of the office in which the patient attends annual eye exams? [facility] If pt is not established with a provider, would they like to be referred to a provider to establish care? No .   Dental Screening: Recommended annual dental exams for proper oral hygiene  Community Resource Referral / Chronic Care Management: CRR required this visit?  No   CCM required this visit?  No      Plan:     I have personally reviewed and noted the following in the patient's chart:   . Medical and social history . Use of alcohol, tobacco or illicit drugs  . Current medications and supplements . Functional ability and status . Nutritional status . Physical activity . Advanced directives . List of other physicians . Hospitalizations, surgeries, and ER visits in previous 12 months . Vitals . Screenings to include cognitive, depression, and falls . Referrals and appointments  In addition, I have reviewed and discussed with patient certain preventive protocols, quality metrics, and best practice recommendations. A written personalized care plan for preventive services as well as general preventive health recommendations were provided to patient.   Due to this being a telephonic visit, the after visit summary with patients personalized plan was offered to patient via mail or my-chart.  Patient preferred to pick up at office at next visit.   Kellie Simmering, LPN   01/24/7671   Nurse Notes: Will bring in dates of covid vaccine to next  visit.

## 2020-01-20 NOTE — Patient Instructions (Signed)
Richard Henry , Thank you for taking time to come for your Medicare Wellness Visit. I appreciate your ongoing commitment to your health goals. Please review the following plan we discussed and let me know if I can assist you in the future.   Screening recommendations/referrals: Colonoscopy: completed 09/05/2017 Recommended yearly ophthalmology/optometry visit for glaucoma screening and checkup Recommended yearly dental visit for hygiene and checkup  Vaccinations: Influenza vaccine: due Pneumococcal vaccine: completed 02/03/2014 Tdap vaccine: due Shingles vaccine: discussed   Covid-19: will bring in dates next visit  Advanced directives: Please bring a copy of your POA (Power of [place]) and/or Living Will to your next appointment.    Conditions/risks identified: smoking  Next appointment: Follow up in one year for your annual wellness visit.   Preventive Care 73 Years and Older, Male Preventive care refers to lifestyle choices and visits with your health care provider that can promote health and wellness. What does preventive care include?  A yearly physical exam. This is also called an annual well check.  Dental exams once or twice a year.  Routine eye exams. Ask your health care provider how often you should have your eyes checked.  Personal lifestyle choices, including:  Daily care of your teeth and gums.  Regular physical activity.  Eating a healthy diet.  Avoiding tobacco and drug use.  Limiting alcohol use.  Practicing safe sex.  Taking low doses of aspirin every day.  Taking vitamin and mineral supplements as recommended by your health care provider. What happens during an annual well check? The services and screenings done by your health care provider during your annual well check will depend on your age, overall health, lifestyle risk factors, and family history of disease. Counseling  Your health care provider may ask you questions about your:  Alcohol  use.  Tobacco use.  Drug use.  Emotional well-being.  Home and relationship well-being.  Sexual activity.  Eating habits.  History of falls.  Memory and ability to understand (cognition).  Work and work Statistician. Screening  You may have the following tests or measurements:  Height, weight, and BMI.  Blood pressure.  Lipid and cholesterol levels. These may be checked every 5 years, or more frequently if you are over 16 years old.  Skin check.  Lung cancer screening. You may have this screening every year starting at age 27 if you have a 30-pack-year history of smoking and currently smoke or have quit within the past 15 years.  Fecal occult blood test (FOBT) of the stool. You may have this test every year starting at age 63.  Flexible sigmoidoscopy or colonoscopy. You may have a sigmoidoscopy every 5 years or a colonoscopy every 10 years starting at age 41.  Prostate cancer screening. Recommendations will vary depending on your family history and other risks.  Hepatitis C blood test.  Hepatitis B blood test.  Sexually transmitted disease (STD) testing.  Diabetes screening. This is done by checking your blood sugar (glucose) after you have not eaten for a while (fasting). You may have this done every 1-3 years.  Abdominal aortic aneurysm (AAA) screening. You may need this if you are a current or former smoker.  Osteoporosis. You may be screened starting at age 49 if you are at high risk. Talk with your health care provider about your test results, treatment options, and if necessary, the need for more tests. Vaccines  Your health care provider may recommend certain vaccines, such as:  Influenza vaccine. This is recommended every  year.  Tetanus, diphtheria, and acellular pertussis (Tdap, Td) vaccine. You may need a Td booster every 10 years.  Zoster vaccine. You may need this after age 42.  Pneumococcal 13-valent conjugate (PCV13) vaccine. One dose is  recommended after age 19.  Pneumococcal polysaccharide (PPSV23) vaccine. One dose is recommended after age 72. Talk to your health care provider about which screenings and vaccines you need and how often you need them. This information is not intended to replace advice given to you by your health care provider. Make sure you discuss any questions you have with your health care provider. Document Released: 07/03/2015 Document Revised: 02/24/2016 Document Reviewed: 04/07/2015 Elsevier Interactive Patient Education  2017 [place] Prevention in the Home Falls can cause injuries. They can happen to people of all ages. There are many things you can do to make your home safe and to help prevent falls. What can I do on the outside of my home?  Regularly fix the edges of walkways and driveways and fix any cracks.  Remove anything that might make you trip as you walk through a door, such as a raised step or threshold.  Trim any bushes or trees on the path to your home.  Use bright outdoor lighting.  Clear any walking paths of anything that might make someone trip, such as rocks or tools.  Regularly check to see if handrails are loose or broken. Make sure that both sides of any steps have handrails.  Any raised decks and porches should have guardrails on the edges.  Have any leaves, snow, or ice cleared regularly.  Use sand or salt on walking paths during winter.  Clean up any spills in your garage right away. This includes oil or grease spills. What can I do in the bathroom?  Use night lights.  Install grab bars by the toilet and in the tub and shower. Do not use towel bars as grab bars.  Use non-skid mats or decals in the tub or shower.  If you need to sit down in the shower, use a plastic, non-slip stool.  Keep the floor dry. Clean up any water that spills on the floor as soon as it happens.  Remove soap buildup in the tub or shower regularly.  Attach bath mats  securely with double-sided non-slip rug tape.  Do not have throw rugs and other things on the floor that can make you trip. What can I do in the bedroom?  Use night lights.  Make sure that you have a light by your bed that is easy to reach.  Do not use any sheets or blankets that are too big for your bed. They should not hang down onto the floor.  Have a firm chair that has side arms. You can use this for support while you get dressed.  Do not have throw rugs and other things on the floor that can make you trip. What can I do in the kitchen?  Clean up any spills right away.  Avoid walking on wet floors.  Keep items that you use a lot in easy-to-reach places.  If you need to reach something above you, use a strong step stool that has a grab bar.  Keep electrical cords out of the way.  Do not use floor polish or wax that makes floors slippery. If you must use wax, use non-skid floor wax.  Do not have throw rugs and other things on the floor that can make you trip. What can  I do with my stairs?  Do not leave any items on the stairs.  Make sure that there are handrails on both sides of the stairs and use them. Fix handrails that are broken or loose. Make sure that handrails are as long as the stairways.  Check any carpeting to make sure that it is firmly attached to the stairs. Fix any carpet that is loose or worn.  Avoid having throw rugs at the top or bottom of the stairs. If you do have throw rugs, attach them to the floor with carpet tape.  Make sure that you have a light switch at the top of the stairs and the bottom of the stairs. If you do not have them, ask someone to add them for you. What else can I do to help prevent falls?  Wear shoes that:  Do not have high heels.  Have rubber bottoms.  Are comfortable and fit you well.  Are closed at the toe. Do not wear sandals.  If you use a stepladder:  Make sure that it is fully opened. Do not climb a closed  stepladder.  Make sure that both sides of the stepladder are locked into place.  Ask someone to hold it for you, if possible.  Clearly mark and make sure that you can see:  Any grab bars or handrails.  First and last steps.  Where the edge of each step is.  Use tools that help you move around (mobility aids) if they are needed. These include:  Canes.  Walkers.  Scooters.  Crutches.  Turn on the lights when you go into a dark area. Replace any light bulbs as soon as they burn out.  Set up your furniture so you have a clear path. Avoid moving your furniture around.  If any of your floors are uneven, fix them.  If there are any pets around you, be aware of where they are.  Review your medicines with your doctor. Some medicines can make you feel dizzy. This can increase your chance of falling. Ask your doctor what other things that you can do to help prevent falls. This information is not intended to replace advice given to you by your health care provider. Make sure you discuss any questions you have with your health care provider. Document Released: 04/02/2009 Document Revised: 11/12/2015 Document Reviewed: 07/11/2014 Elsevier Interactive Patient Education  2017 [employer].

## 2020-01-21 ENCOUNTER — Telehealth: Payer: Self-pay | Admitting: *Deleted

## 2020-01-21 NOTE — Telephone Encounter (Signed)
Pt has been notified that lung cancer screening CT scan is due currently or will be in near future. Confirmed pt is within appropriate age range, and asymptomatic. Pt denies illness that would prevent curative treatment for lung cancer if found. Verified smoking history (Current Smoker, 5-10 cigarettes pd ). Pt received 2nd COVID VX in March 2021. Pt is agreeable for CT scan being scheduled.

## 2020-01-22 ENCOUNTER — Other Ambulatory Visit: Payer: Self-pay | Admitting: *Deleted

## 2020-01-22 DIAGNOSIS — Z122 Encounter for screening for malignant neoplasm of respiratory organs: Secondary | ICD-10-CM

## 2020-01-22 DIAGNOSIS — Z87891 Personal history of nicotine dependence: Secondary | ICD-10-CM

## 2020-01-29 ENCOUNTER — Encounter: Payer: Self-pay | Admitting: Family Medicine

## 2020-02-13 ENCOUNTER — Ambulatory Visit
Admission: RE | Admit: 2020-02-13 | Discharge: 2020-02-13 | Disposition: A | Discharge status: Home / Self Care | Payer: Medicare Other | Source: Ambulatory Visit | Attending: Oncology | Admitting: Oncology

## 2020-02-13 ENCOUNTER — Other Ambulatory Visit: Payer: Self-pay

## 2020-02-13 DIAGNOSIS — Z122 Encounter for screening for malignant neoplasm of respiratory organs: Secondary | ICD-10-CM

## 2020-02-13 DIAGNOSIS — Z87891 Personal history of nicotine dependence: Secondary | ICD-10-CM

## 2020-02-15 ENCOUNTER — Encounter: Payer: Self-pay | Admitting: *Deleted

## 2020-02-16 ENCOUNTER — Encounter: Payer: Self-pay | Admitting: Nurse Practitioner

## 2020-02-16 DIAGNOSIS — I700 Atherosclerosis of aorta: Secondary | ICD-10-CM | POA: Insufficient documentation

## 2020-05-15 ENCOUNTER — Other Ambulatory Visit: Payer: Self-pay | Admitting: Family Medicine

## 2020-06-05 ENCOUNTER — Encounter: Payer: Self-pay | Admitting: Nurse Practitioner

## 2020-06-09 ENCOUNTER — Other Ambulatory Visit: Payer: Self-pay

## 2020-06-09 ENCOUNTER — Encounter: Payer: Self-pay | Admitting: Nurse Practitioner

## 2020-06-09 ENCOUNTER — Ambulatory Visit (INDEPENDENT_AMBULATORY_CARE_PROVIDER_SITE_OTHER): Payer: Medicare Other | Admitting: Nurse Practitioner

## 2020-06-09 ENCOUNTER — Ambulatory Visit: Payer: Medicare Other | Admitting: Family Medicine

## 2020-06-09 VITALS — BP 115/73 | HR 69 | Temp 97.8°F | Ht 70.47 in | Wt 186.0 lb

## 2020-06-09 DIAGNOSIS — E782 Mixed hyperlipidemia: Secondary | ICD-10-CM

## 2020-06-09 DIAGNOSIS — I700 Atherosclerosis of aorta: Secondary | ICD-10-CM

## 2020-06-09 DIAGNOSIS — I10 Essential (primary) hypertension: Secondary | ICD-10-CM

## 2020-06-09 DIAGNOSIS — F17210 Nicotine dependence, cigarettes, uncomplicated: Secondary | ICD-10-CM

## 2020-06-09 DIAGNOSIS — J432 Centrilobular emphysema: Secondary | ICD-10-CM

## 2020-06-09 NOTE — Assessment & Plan Note (Signed)
Chronic, ongoing.  Continue current medication regimen and adjust as needed. Lipid panel today. 

## 2020-06-09 NOTE — Progress Notes (Signed)
BP 115/73   Pulse 69   Temp 97.8 F (36.6 C) (Oral)   Ht 5' 10.47" (1.79 m)   Wt 186 lb (84.4 kg)   SpO2 98%   BMI 26.33 kg/m    Subjective:    Patient ID: Richard Henry, male    DOB: Oct 16, 1945, 74 y.o.   MRN: [id number]  HPI: Richard Henry is a 74 y.o. male  Chief Complaint  Patient presents with  . Hypertension   HYPERTENSION/HLD Continues on Metoprolol for HTN and Atorvastatin for HLD.  June 2021 -- LDL 96. Hypertension status: stable  Satisfied with current treatment? yes Duration of hypertension: chronic BP monitoring frequency:  not checking BP range:  BP medication side effects:  no Medication compliance: good compliance Aspirin: no Recurrent headaches: no Visual changes: no Palpitations: no Dyspnea: no Chest pain: no Lower extremity edema: no Dizzy/lightheaded: no   COPD No current inhalers.  Noted on on lung CT screening.  Screening also noted aortic atherosclerosis.  Last CT 02/13/2020.  Is a current smoker, smoked on and off since age 50-17.  Currently smoking 1/2 PPD and sometimes less, is cutting back.   COPD status: stable Satisfied with current treatment?: yes Oxygen use: no Dyspnea frequency: none Cough frequency: none Rescue inhaler frequency:  none Limitation of activity: no Productive cough: none Last Spirometry: unknown Pneumovax: Up to Date Influenza: Up to Date  Relevant past medical, surgical, family and social history reviewed and updated as indicated. Interim medical history since our last visit reviewed. Allergies and medications reviewed and updated.  Review of Systems  Constitutional: Negative for activity change, diaphoresis, fatigue and fever.  Respiratory: Negative for cough, chest tightness, shortness of breath and wheezing.   Cardiovascular: Negative for chest pain, palpitations and leg swelling.  Gastrointestinal: Negative.   Neurological: Negative.   Psychiatric/Behavioral: Negative.     Per HPI unless specifically  indicated above     Objective:    BP 115/73   Pulse 69   Temp 97.8 F (36.6 C) (Oral)   Ht 5' 10.47" (1.79 m)   Wt 186 lb (84.4 kg)   SpO2 98%   BMI 26.33 kg/m   Wt Readings from Last 3 Encounters:  06/09/20 186 lb (84.4 kg)  02/13/20 173 lb (78.5 kg)  01/20/20 174 lb (78.9 kg)    Physical Exam Vitals and nursing note reviewed.  Constitutional:      General: He is awake. He is not in acute distress.    Appearance: He is well-developed and well-groomed. He is not ill-appearing.  HENT:     Head: Normocephalic and atraumatic.     Right Ear: Hearing normal. No drainage.     Left Ear: Hearing normal. No drainage.     Mouth/Throat:     Pharynx: Uvula midline.  Eyes:     General: Lids are normal. No scleral icterus.       Right eye: No discharge.        Left eye: No discharge.     Conjunctiva/sclera: Conjunctivae normal.     Pupils: Pupils are equal, round, and reactive to light.  Neck:     Thyroid: No thyromegaly.     Vascular: No carotid bruit or JVD.     Trachea: Trachea normal.  Cardiovascular:     Rate and Rhythm: Normal rate and regular rhythm.     Heart sounds: Normal heart sounds, S1 normal and S2 normal. No murmur heard. No gallop.   Pulmonary:     Effort:  Pulmonary effort is normal. No accessory muscle usage or respiratory distress.     Breath sounds: Normal breath sounds.  Abdominal:     General: Bowel sounds are normal.     Palpations: Abdomen is soft.  Musculoskeletal:        General: Normal range of motion.     Cervical back: Normal range of motion and neck supple.     Right lower leg: No edema.     Left lower leg: No edema.  Skin:    General: Skin is warm and dry.     Capillary Refill: Capillary refill takes less than 2 seconds.  Neurological:     Mental Status: He is alert and oriented to person, place, and time.     Deep Tendon Reflexes: Reflexes are normal and symmetric.     Reflex Scores:      Brachioradialis reflexes are 2+ on the right side  and 2+ on the left side.      Patellar reflexes are 2+ on the right side and 2+ on the left side. Psychiatric:        Attention and Perception: Attention normal.        Mood and Affect: Mood normal.        Speech: Speech normal.        Behavior: Behavior normal. Behavior is cooperative.        Thought Content: Thought content normal.     Results for orders placed or performed in visit on 12/09/19  CBC with Differential/Platelet  Result Value Ref Range   WBC 6.3 3.4 - 10.8 x10E3/uL   RBC 4.61 4.14 - 5.80 x10E6/uL   Hemoglobin 14.9 13.0 - 17.7 g/dL   Hematocrit 75.4 49.2 - 51.0 %   MCV 96 79 - 97 fL   MCH 32.3 26.6 - 33.0 pg   MCHC 33.8 31.5 - 35.7 g/dL   RDW 01.0 07.1 - 21.9 %   Platelets 196 150 - 450 x10E3/uL   Neutrophils 48 Not Estab. %   Lymphs 31 Not Estab. %   Monocytes 12 Not Estab. %   Eos 8 Not Estab. %   Basos 1 Not Estab. %   Neutrophils Absolute 3.0 1.4 - 7.0 x10E3/uL   Lymphocytes Absolute 1.9 0.7 - 3.1 x10E3/uL   Monocytes Absolute 0.8 0.1 - 0.9 x10E3/uL   EOS (ABSOLUTE) 0.5 (H) 0.0 - 0.4 x10E3/uL   Basophils Absolute 0.1 0.0 - 0.2 x10E3/uL   Immature Granulocytes 0 Not Estab. %   Immature Grans (Abs) 0.0 0.0 - 0.1 x10E3/uL  Comprehensive metabolic panel  Result Value Ref Range   Glucose 95 65 - 99 mg/dL   BUN 13 8 - 27 mg/dL   Creatinine, Ser 7.58 0.76 - 1.27 mg/dL   GFR calc non Af Amer 66 >59 mL/min/1.73   GFR calc Af Amer 76 >59 mL/min/1.73   BUN/Creatinine Ratio 12 10 - 24   Sodium 139 134 - 144 mmol/L   Potassium 5.0 3.5 - 5.2 mmol/L   Chloride 104 96 - 106 mmol/L   CO2 25 20 - 29 mmol/L   Calcium 9.7 8.6 - 10.2 mg/dL   Total Protein 6.6 6.0 - 8.5 g/dL   Albumin 4.1 3.7 - 4.7 g/dL   Globulin, Total 2.5 1.5 - 4.5 g/dL   Albumin/Globulin Ratio 1.6 1.2 - 2.2   Bilirubin Total 0.3 0.0 - 1.2 mg/dL   Alkaline Phosphatase 49 48 - 121 IU/L   AST 19 0 - 40 IU/L  ALT 16 0 - 44 IU/L  Lipid Panel w/o Chol/HDL Ratio  Result Value Ref Range    Cholesterol, Total 167 100 - 199 mg/dL   Triglycerides 56 0 - 149 mg/dL   HDL 60 >39 mg/dL   VLDL Cholesterol Cal 11 5 - 40 mg/dL   LDL Chol Calc (NIH) 96 0 - 99 mg/dL  PSA  Result Value Ref Range   Prostate Specific Ag, Serum 0.2 0.0 - 4.0 ng/mL  UA/M w/rflx Culture, Routine   Specimen: Urine   Urine  Result Value Ref Range   Specific Gravity, UA 1.020 1.005 - 1.030   pH, UA 5.5 5.0 - 7.5   Color, UA Yellow Yellow   Appearance Ur Clear Clear   Leukocytes,UA Negative Negative   Protein,UA Negative Negative/Trace   Glucose, UA Negative Negative   Ketones, UA Negative Negative   RBC, UA Negative Negative   Bilirubin, UA Negative Negative   Urobilinogen, Ur 0.2 0.2 - 1.0 mg/dL   Nitrite, UA Negative Negative      Assessment & Plan:   Problem List Items Addressed This Visit      Cardiovascular and Mediastinum   Hypertension    Chronic, stable with BP at goal today.  Recommend he monitor BP at least a few mornings a week at home and document.  DASH diet at home.  Continue current medication regimen and adjust as needed.  Labs today.  Return in 6 months.       Relevant Medications   metoprolol succinate (TOPROL-XL) 25 MG 24 hr tablet   Other Relevant Orders   Basic metabolic panel   Aortic atherosclerosis ([place])    Noted on CT imaging in August 2021.  Recommend he continue daily statin and continue to cut back, work on complete cessation smoking.  Would benefit from a daily Baby ASA 81 MG.  Continue to monitor.      Relevant Medications   metoprolol succinate (TOPROL-XL) 25 MG 24 hr tablet     Respiratory   Centrilobular emphysema ([facility]) - Primary    Chronic, stable without inhalers.  Continue yearly lung CT screening.  Recommend complete cessation smoking.  Would benefit from spirometry next visit to obtain baseline.  Initiate inhalers as needed.        Other   Nicotine dependence, cigarettes, uncomplicated    I have recommended complete cessation of tobacco use. I  have discussed various options available for assistance with tobacco cessation including over the counter methods (Nicotine gum, patch and lozenges). We also discussed prescription options (Chantix, Nicotine Inhaler / Nasal Spray). The patient is not interested in pursuing any prescription tobacco cessation options at this time.       Hyperlipidemia    Chronic, ongoing.  Continue current medication regimen and adjust as needed. Lipid panel today.         Relevant Medications   metoprolol succinate (TOPROL-XL) 25 MG 24 hr tablet   Other Relevant Orders   Lipid Panel w/o Chol/HDL Ratio       Follow up plan: Return in about 6 months (around 12/08/2020) for HTN/HLD, COPD -- would benefit from spirometry, has not had.

## 2020-06-09 NOTE — Assessment & Plan Note (Signed)
Chronic, stable with BP at goal today.  Recommend he monitor BP at least a few mornings a week at home and document.  DASH diet at home.  Continue current medication regimen and adjust as needed.  Labs today.  Return in 6 months.

## 2020-06-09 NOTE — Assessment & Plan Note (Signed)
I have recommended complete cessation of tobacco use. I have discussed various options available for assistance with tobacco cessation including over the counter methods (Nicotine gum, patch and lozenges). We also discussed prescription options (Chantix, Nicotine Inhaler / Nasal Spray). The patient is not interested in pursuing any prescription tobacco cessation options at this time.  

## 2020-06-09 NOTE — Patient Instructions (Signed)
DASH Eating Plan DASH stands for "Dietary Approaches to Stop Hypertension." The DASH eating plan is a healthy eating plan that has been shown to reduce high blood pressure (hypertension). It may also reduce your risk for type 2 diabetes, heart disease, and stroke. The DASH eating plan may also help with weight loss. What are tips for following this plan?  General guidelines  Avoid eating more than 2,300 mg (milligrams) of salt (sodium) a day. If you have hypertension, you may need to reduce your sodium intake to 1,500 mg a day.  Limit alcohol intake to no more than 1 drink a day for nonpregnant women and 2 drinks a day for men. One drink equals 12 oz of beer, 5 oz of wine, or 1 oz of hard liquor.  Work with your health care provider to maintain a healthy body weight or to lose weight. Ask what an ideal weight is for you.  Get at least 30 minutes of exercise that causes your heart to beat faster (aerobic exercise) most days of the week. Activities may include walking, swimming, or biking.  Work with your health care provider or diet and nutrition specialist (dietitian) to adjust your eating plan to your individual calorie needs. Reading food labels   Check food labels for the amount of sodium per serving. Choose foods with less than 5 percent of the Daily Value of sodium. Generally, foods with less than 300 mg of sodium per serving fit into this eating plan.  To find whole grains, look for the word "whole" as the first word in the ingredient list. Shopping  Buy products labeled as "low-sodium" or "no salt added."  Buy fresh foods. Avoid canned foods and premade or frozen meals. Cooking  Avoid adding salt when cooking. Use salt-free seasonings or herbs instead of table salt or sea salt. Check with your health care provider or pharmacist before using salt substitutes.  Do not fry foods. Cook foods using healthy methods such as baking, boiling, grilling, and broiling instead.  Cook with  heart-healthy oils, such as olive, canola, soybean, or sunflower oil. Meal planning  Eat a balanced diet that includes: ? 5 or more servings of fruits and vegetables each day. At each meal, try to fill half of your plate with fruits and vegetables. ? Up to 6-8 servings of whole grains each day. ? Less than 6 oz of lean meat, poultry, or fish each day. A 3-oz serving of meat is about the same size as a deck of cards. One egg equals 1 oz. ? 2 servings of low-fat dairy each day. ? A serving of nuts, seeds, or beans 5 times each week. ? Heart-healthy fats. Healthy fats called Omega-3 fatty acids are found in foods such as flaxseeds and coldwater fish, like sardines, salmon, and mackerel.  Limit how much you eat of the following: ? Canned or prepackaged foods. ? Food that is high in trans fat, such as fried foods. ? Food that is high in saturated fat, such as fatty meat. ? Sweets, desserts, sugary drinks, and other foods with added sugar. ? Full-fat dairy products.  Do not salt foods before eating.  Try to eat at least 2 vegetarian meals each week.  Eat more home-cooked food and less restaurant, buffet, and fast food.  When eating at a restaurant, ask that your food be prepared with less salt or no salt, if possible. What foods are recommended? The items listed may not be a complete list. Talk with your dietitian about   what dietary choices are best for you. Grains Whole-grain or whole-wheat bread. Whole-grain or whole-wheat pasta. Brown rice. Oatmeal. Quinoa. Bulgur. Whole-grain and low-sodium cereals. Pita bread. Low-fat, low-sodium crackers. Whole-wheat flour tortillas. Vegetables Fresh or frozen vegetables (raw, steamed, roasted, or grilled). Low-sodium or reduced-sodium tomato and vegetable juice. Low-sodium or reduced-sodium tomato sauce and tomato paste. Low-sodium or reduced-sodium canned vegetables. Fruits All fresh, dried, or frozen fruit. Canned fruit in natural juice (without  added sugar). Meat and other protein foods Skinless chicken or turkey. Ground chicken or turkey. Pork with fat trimmed off. Fish and seafood. Egg whites. Dried beans, peas, or lentils. Unsalted nuts, nut butters, and seeds. Unsalted canned beans. Lean cuts of beef with fat trimmed off. Low-sodium, lean deli meat. Dairy Low-fat (1%) or fat-free (skim) milk. Fat-free, low-fat, or reduced-fat cheeses. Nonfat, low-sodium ricotta or cottage cheese. Low-fat or nonfat yogurt. Low-fat, low-sodium cheese. Fats and oils Soft margarine without trans fats. Vegetable oil. Low-fat, reduced-fat, or light mayonnaise and salad dressings (reduced-sodium). Canola, safflower, olive, soybean, and sunflower oils. Avocado. Seasoning and other foods Herbs. Spices. Seasoning mixes without salt. Unsalted popcorn and pretzels. Fat-free sweets. What foods are not recommended? The items listed may not be a complete list. Talk with your dietitian about what dietary choices are best for you. Grains Baked goods made with fat, such as croissants, muffins, or some breads. Dry pasta or rice meal packs. Vegetables Creamed or fried vegetables. Vegetables in a cheese sauce. Regular canned vegetables (not low-sodium or reduced-sodium). Regular canned tomato sauce and paste (not low-sodium or reduced-sodium). Regular tomato and vegetable juice (not low-sodium or reduced-sodium). Pickles. Olives. Fruits Canned fruit in a light or heavy syrup. Fried fruit. Fruit in cream or butter sauce. Meat and other protein foods Fatty cuts of meat. Ribs. Fried meat. Bacon. Sausage. Bologna and other processed lunch meats. Salami. Fatback. Hotdogs. Bratwurst. Salted nuts and seeds. Canned beans with added salt. Canned or smoked fish. Whole eggs or egg yolks. Chicken or turkey with skin. Dairy Whole or 2% milk, cream, and half-and-half. Whole or full-fat cream cheese. Whole-fat or sweetened yogurt. Full-fat cheese. Nondairy creamers. Whipped toppings.  Processed cheese and cheese spreads. Fats and oils Butter. Stick margarine. Lard. Shortening. Ghee. Bacon fat. Tropical oils, such as coconut, palm kernel, or palm oil. Seasoning and other foods Salted popcorn and pretzels. Onion salt, garlic salt, seasoned salt, table salt, and sea salt. Worcestershire sauce. Tartar sauce. Barbecue sauce. Teriyaki sauce. Soy sauce, including reduced-sodium. Steak sauce. Canned and packaged gravies. Fish sauce. Oyster sauce. Cocktail sauce. Horseradish that you find on the shelf. Ketchup. Mustard. Meat flavorings and tenderizers. Bouillon cubes. Hot sauce and Tabasco sauce. Premade or packaged marinades. Premade or packaged taco seasonings. Relishes. Regular salad dressings. Where to find more information:  National Heart, Lung, and Blood Institute: www.nhlbi.nih.gov  American Heart Association: www.heart.org Summary  The DASH eating plan is a healthy eating plan that has been shown to reduce high blood pressure (hypertension). It may also reduce your risk for type 2 diabetes, heart disease, and stroke.  With the DASH eating plan, you should limit salt (sodium) intake to 2,300 mg a day. If you have hypertension, you may need to reduce your sodium intake to 1,500 mg a day.  When on the DASH eating plan, aim to eat more fresh fruits and vegetables, whole grains, lean proteins, low-fat dairy, and heart-healthy fats.  Work with your health care provider or diet and nutrition specialist (dietitian) to adjust your eating plan to your   individual calorie needs. This information is not intended to replace advice given to you by your health care provider. Make sure you discuss any questions you have with your health care provider. Document Revised: 05/19/2017 Document Reviewed: 05/30/2016 Elsevier Patient Education  2020 [employer]  

## 2020-06-09 NOTE — Assessment & Plan Note (Signed)
Chronic, stable without inhalers.  Continue yearly lung CT screening.  Recommend complete cessation smoking.  Would benefit from spirometry next visit to obtain baseline.  Initiate inhalers as needed.

## 2020-06-09 NOTE — Assessment & Plan Note (Signed)
Noted on CT imaging in August 2021.  Recommend he continue daily statin and continue to cut back, work on complete cessation smoking.  Would benefit from a daily Baby ASA 81 MG.  Continue to monitor.

## 2020-06-10 LAB — BASIC METABOLIC PANEL
BUN/Creatinine Ratio: 12 (ref 10–24)
BUN: 13 mg/dL (ref 8–27)
CO2: 26 mmol/L (ref 20–29)
Calcium: 10.3 mg/dL — ABNORMAL HIGH (ref 8.6–10.2)
Chloride: 102 mmol/L (ref 96–106)
Creatinine, Ser: 1.09 mg/dL (ref 0.76–1.27)
GFR calc Af Amer: 77 mL/min/{1.73_m2} (ref >59)
GFR calc non Af Amer: 67 mL/min/{1.73_m2} (ref >59)
Glucose: 84 mg/dL (ref 65–99)
Potassium: 5.3 mmol/L — ABNORMAL HIGH (ref 3.5–5.2)
Sodium: 141 mmol/L (ref 134–144)

## 2020-06-10 LAB — LIPID PANEL W/O CHOL/HDL RATIO
Cholesterol, Total: 186 mg/dL (ref 100–199)
HDL: 57 mg/dL (ref >39)
LDL Chol Calc (NIH): 115 mg/dL — ABNORMAL HIGH (ref 0–99)
Triglycerides: 78 mg/dL (ref 0–149)
VLDL Cholesterol Cal: 14 mg/dL (ref 5–40)

## 2020-06-10 NOTE — Progress Notes (Signed)
Please let Richard Henry know his labs have returned.  Kidney function remains stable.  Potassium was mildly elevated, I would like him to drink more fluid and decrease foods high in potassium, such as bananas, mangoes, dried fruit, potatoes.  Will recheck this next visit.  Cholesterol levels not at goal, please ensure you are taking Atorvastatin daily and if ongoing elevation next visit may need to increase dose.  Have a Merry Christmas!!

## 2020-09-24 ENCOUNTER — Other Ambulatory Visit (HOSPITAL_COMMUNITY): Payer: Self-pay

## 2021-01-20 ENCOUNTER — Ambulatory Visit: Payer: Medicare Other

## 2021-01-22 ENCOUNTER — Ambulatory Visit (INDEPENDENT_AMBULATORY_CARE_PROVIDER_SITE_OTHER): Payer: Medicare Other

## 2021-01-22 VITALS — Ht 71.0 in | Wt 170.0 lb

## 2021-01-22 DIAGNOSIS — Z0000 Encounter for general adult medical examination without abnormal findings: Secondary | ICD-10-CM

## 2021-01-22 NOTE — Patient Instructions (Signed)
Richard Henry , Thank you for taking time to come for your Medicare Wellness Visit. I appreciate your ongoing commitment to your health goals. Please review the following plan we discussed and let me know if I can assist you in the future.   Screening recommendations/referrals: Colonoscopy: not required Recommended yearly ophthalmology/optometry visit for glaucoma screening and checkup Recommended yearly dental visit for hygiene and checkup  Vaccinations: Influenza vaccine: due Pneumococcal vaccine: completed 02/03/2014 Tdap vaccine: due Shingles vaccine: discussed   Covid-19: 10/27/2020, 02/26/2020, 08/20/2019, 07/31/2019  Advanced directives: Please bring a copy of your POA (Power of Attorney) and/or Living Will to your next appointment.   Conditions/risks identified: none  Next appointment: Follow up in one year for your annual wellness visit.   Preventive Care 30 Years and Older, Male Preventive care refers to lifestyle choices and visits with your health care provider that can promote health and wellness. What does preventive care include? A yearly physical exam. This is also called an annual well check. Dental exams once or twice a year. Routine eye exams. Ask your health care provider how often you should have your eyes checked. Personal lifestyle choices, including: Daily care of your teeth and gums. Regular physical activity. Eating a healthy diet. Avoiding tobacco and drug use. Limiting alcohol use. Practicing safe sex. Taking low doses of aspirin every day. Taking vitamin and mineral supplements as recommended by your health care provider. What happens during an annual well check? The services and screenings done by your health care provider during your annual well check will depend on your age, overall health, lifestyle risk factors, and family history of disease. Counseling  Your health care provider may ask you questions about your: Alcohol use. Tobacco use. Drug  use. Emotional well-being. Home and relationship well-being. Sexual activity. Eating habits. History of falls. Memory and ability to understand (cognition). Work and work Statistician. Screening  You may have the following tests or measurements: Height, weight, and BMI. Blood pressure. Lipid and cholesterol levels. These may be checked every 5 years, or more frequently if you are over 62 years old. Skin check. Lung cancer screening. You may have this screening every year starting at age 80 if you have a 30-pack-year history of smoking and currently smoke or have quit within the past 15 years. Fecal occult blood test (FOBT) of the stool. You may have this test every year starting at age 55. Flexible sigmoidoscopy or colonoscopy. You may have a sigmoidoscopy every 5 years or a colonoscopy every 10 years starting at age 49. Prostate cancer screening. Recommendations will vary depending on your family history and other risks. Hepatitis C blood test. Hepatitis B blood test. Sexually transmitted disease (STD) testing. Diabetes screening. This is done by checking your blood sugar (glucose) after you have not eaten for a while (fasting). You may have this done every 1-3 years. Abdominal aortic aneurysm (AAA) screening. You may need this if you are a current or former smoker. Osteoporosis. You may be screened starting at age 82 if you are at high risk. Talk with your health care provider about your test results, treatment options, and if necessary, the need for more tests. Vaccines  Your health care provider may recommend certain vaccines, such as: Influenza vaccine. This is recommended every year. Tetanus, diphtheria, and acellular pertussis (Tdap, Td) vaccine. You may need a Td booster every 10 years. Zoster vaccine. You may need this after age 61. Pneumococcal 13-valent conjugate (PCV13) vaccine. One dose is recommended after age 75. Pneumococcal  polysaccharide (PPSV23) vaccine. One dose is  recommended after age 50. Talk to your health care provider about which screenings and vaccines you need and how often you need them. This information is not intended to replace advice given to you by your health care provider. Make sure you discuss any questions you have with your health care provider. Document Released: 07/03/2015 Document Revised: 02/24/2016 Document Reviewed: 04/07/2015 Elsevier Interactive Patient Education  2017 [place] Prevention in the Home Falls can cause injuries. They can happen to people of all ages. There are many things you can do to make your home safe and to help prevent falls. What can I do on the outside of my home? Regularly fix the edges of walkways and driveways and fix any cracks. Remove anything that might make you trip as you walk through a door, such as a raised step or threshold. Trim any bushes or trees on the path to your home. Use bright outdoor lighting. Clear any walking paths of anything that might make someone trip, such as rocks or tools. Regularly check to see if handrails are loose or broken. Make sure that both sides of any steps have handrails. Any raised decks and porches should have guardrails on the edges. Have any leaves, snow, or ice cleared regularly. Use sand or salt on walking paths during winter. Clean up any spills in your garage right away. This includes oil or grease spills. What can I do in the bathroom? Use night lights. Install grab bars by the toilet and in the tub and shower. Do not use towel bars as grab bars. Use non-skid mats or decals in the tub or shower. If you need to sit down in the shower, use a plastic, non-slip stool. Keep the floor dry. Clean up any water that spills on the floor as soon as it happens. Remove soap buildup in the tub or shower regularly. Attach bath mats securely with double-sided non-slip rug tape. Do not have throw rugs and other things on the floor that can make you  trip. What can I do in the bedroom? Use night lights. Make sure that you have a light by your bed that is easy to reach. Do not use any sheets or blankets that are too big for your bed. They should not hang down onto the floor. Have a firm chair that has side arms. You can use this for support while you get dressed. Do not have throw rugs and other things on the floor that can make you trip. What can I do in the kitchen? Clean up any spills right away. Avoid walking on wet floors. Keep items that you use a lot in easy-to-reach places. If you need to reach something above you, use a strong step stool that has a grab bar. Keep electrical cords out of the way. Do not use floor polish or wax that makes floors slippery. If you must use wax, use non-skid floor wax. Do not have throw rugs and other things on the floor that can make you trip. What can I do with my stairs? Do not leave any items on the stairs. Make sure that there are handrails on both sides of the stairs and use them. Fix handrails that are broken or loose. Make sure that handrails are as long as the stairways. Check any carpeting to make sure that it is firmly attached to the stairs. Fix any carpet that is loose or worn. Avoid having throw rugs at the top  or bottom of the stairs. If you do have throw rugs, attach them to the floor with carpet tape. Make sure that you have a light switch at the top of the stairs and the bottom of the stairs. If you do not have them, ask someone to add them for you. What else can I do to help prevent falls? Wear shoes that: Do not have high heels. Have rubber bottoms. Are comfortable and fit you well. Are closed at the toe. Do not wear sandals. If you use a stepladder: Make sure that it is fully opened. Do not climb a closed stepladder. Make sure that both sides of the stepladder are locked into place. Ask someone to hold it for you, if possible. Clearly mark and make sure that you can  see: Any grab bars or handrails. First and last steps. Where the edge of each step is. Use tools that help you move around (mobility aids) if they are needed. These include: Canes. Walkers. Scooters. Crutches. Turn on the lights when you go into a dark area. Replace any light bulbs as soon as they burn out. Set up your furniture so you have a clear path. Avoid moving your furniture around. If any of your floors are uneven, fix them. If there are any pets around you, be aware of where they are. Review your medicines with your doctor. Some medicines can make you feel dizzy. This can increase your chance of falling. Ask your doctor what other things that you can do to help prevent falls. This information is not intended to replace advice given to you by your health care provider. Make sure you discuss any questions you have with your health care provider. Document Released: 04/02/2009 Document Revised: 11/12/2015 Document Reviewed: 07/11/2014 Elsevier Interactive Patient Education  2017 [employer].

## 2021-01-22 NOTE — Progress Notes (Signed)
I connected with Richard Henry today by telephone and verified that I am speaking with the correct person using two identifiers. Location patient: home Location provider: work Persons participating in the virtual visit: Kevaughn Cutbirth, Glenna Durand LPN.   I discussed the limitations, risks, security and privacy concerns of performing an evaluation and management service by telephone and the availability of in person appointments. I also discussed with the patient that there may be a patient responsible charge related to this service. The patient expressed understanding and verbally consented to this telephonic visit.    Interactive audio and video telecommunications were attempted between this provider and patient, however failed, due to patient having technical difficulties OR patient did not have access to video capability.  We continued and completed visit with audio only.     Vital signs may be patient reported or missing.  Subjective:   Richard Henry is a 75 y.o. male who presents for Medicare Annual/Subsequent preventive examination.  Review of Systems     Cardiac Risk Factors include: advanced age (>69mn, >>58women);dyslipidemia;hypertension;male gender     Objective:    Today's Vitals   01/22/21 1111  Weight: 170 lb (77.1 kg)  Height: '5\' 11"'$  (1.803 m)   Body mass index is 23.71 kg/m.  Advanced Directives 01/22/2021 01/20/2020 11/08/2017 07/19/2017 02/28/2017 07/18/2016 07/14/2015  Does Patient Have a Medical Advance Directive? Yes Yes No Yes Yes Yes Yes  Type of AParamedicof ANew [place]Living will [place]Living will - [place]ster Gap[place] will Living will;Healthcare Power of [place]Living will [place]Living will  Does patient want to make changes to medical advance directive? - - - - - - No - Patient declined  Copy of [place] Chart? No - copy  requested No - copy requested - Yes No - copy requested No - copy requested Yes  Would patient like information on creating a medical advance directive? - - No - Patient declined - - - -    Current Medications (verified) Outpatient Encounter Medications as of 01/22/2021  Medication Sig   atorvastatin (LIPITOR) 40 MG tablet TAKE 1 TABLET BY MOUTH  DAILY   bimatoprost (LUMIGAN) 0.01 % SOLN Apply to eye.   metoprolol succinate (TOPROL-XL) 25 MG 24 hr tablet Take 25 mg by mouth daily.   Multiple Vitamin (MULTIVITAMIN) tablet Take 1 tablet by mouth daily.   COMBIGAN 0.2-0.5 % ophthalmic solution Apply to eye. (Patient not taking: Reported on 01/22/2021)   No facility-administered encounter medications on file as of 01/22/2021.    Allergies (verified) Patient has no known allergies.   History: Past Medical History:  Diagnosis Date   Cancer ([place]    prostate   Glaucoma    Hyperlipidemia    Hypertension    Personal history of tobacco use, presenting hazards to health 08/06/2015   Tobacco use disorder    Past Surgical History:  Procedure Laterality Date   COLONOSCOPY WITH PROPOFOL N/A 09/05/2017   Procedure: COLONOSCOPY WITH PROPOFOL;  Surgeon: WLucilla Lame MD;  [facility]  Service: Endoscopy;  Laterality: N/A;   EYE SURGERY     PROSTATE SURGERY  06/28/12   RETINAL DETACHMENT SURGERY     Family History  Problem Relation Age of Onset   Hypertension Mother    Osteoporosis Mother    Cancer Father        lung   Hypertension Father    Social History   Socioeconomic  History   Marital status: Married    Spouse name: Not on file   Number of children: Not on file   Years of education: Not on file   Highest education level: Some college, no degree  Occupational History   Occupation: retired  Tobacco Use   Smoking status: Some Days    Packs/day: 0.25    Years: 37.00    Pack years: 9.25    Types: Cigarettes   Smokeless tobacco: Never  Vaping Use   Vaping Use: Never  used  Substance and Sexual Activity   Alcohol use: Yes    Alcohol/week: 12.0 standard drinks    Types: 12 Cans of beer per week   Drug use: No   Sexual activity: Not Currently  Other Topics Concern   Not on file  Social History Narrative   Golfing once a week    Social Determinants of Health   Financial Resource Strain: Low Risk    Difficulty of Paying Living Expenses: Not hard at all  Food Insecurity: No Food Insecurity   Worried About Charity fundraiser in the Last Year: Never true   Arboriculturist in the Last Year: Never true  Transportation Needs: No Transportation Needs   Lack of Transportation (Medical): No   Lack of Transportation (Non-Medical): No  Physical Activity: Inactive   Days of Exercise per Week: 0 days   Minutes of Exercise per Session: 0 min  Stress: No Stress Concern Present   Feeling of Stress : Not at all  Social Connections: Not on file    Tobacco Counseling Ready to quit: Not Answered Counseling given: Not Answered   Clinical Intake:  Pre-visit preparation completed: Yes  Pain : No/denies pain     Nutritional Status: BMI of 19-24  Normal Nutritional Risks: None Diabetes: No  How often do you need to have someone help you when you read instructions, pamphlets, or other written materials from your doctor or pharmacy?: 1 - Never What is the last grade level you completed in school?: 33yr  Diabetic? no  Interpreter Needed?: No  Information entered by :: NAllen LPN   Activities of Daily Living In your present state of health, do you have any difficulty performing the following activities: 01/22/2021  Hearing? N  Vision? N  Difficulty concentrating or making decisions? N  Walking or climbing stairs? N  Dressing or bathing? N  Doing errands, shopping? N  Preparing Food and eating ? N  Using the Toilet? N  In the past six months, have you accidently leaked urine? N  Do you have problems with loss of bowel control? N  Managing your  Medications? N  Managing your Finances? N  Housekeeping or managing your Housekeeping? N  Some recent data might be hidden    Patient Care Team: HJon Billings NP as PCP - General WKathrine Haddock NP as PCP - Family Medicine (Nurse Practitioner) DSandra Cockayne SRemo Lipps MD (Ophthalmology)  Indicate any recent Medical Services you may have received from other than [facility] providers in the past year (date may be approximate).     Assessment:   This is a routine wellness examination for Richard Henry  Hearing/Vision screen Vision Screening - Comments:: [facility] exams, [facility] Dietary issues and exercise activities discussed: Current Exercise Habits: The patient does not participate in regular exercise at present   Goals Addressed             This Visit's Progress    Patient Stated  01/22/2021, live to 100       Depression Screen PHQ 2/9 Scores 01/22/2021 01/20/2020 12/09/2019 12/19/2018 12/05/2018 07/19/2017 07/18/2016  PHQ - 2 Score 0 0 0 0 0 0 1  PHQ- 9 Score - - 1 - 2 - 3    Fall Risk Fall Risk  01/22/2021 06/09/2020 01/20/2020 12/09/2019 12/19/2018  Falls in the past year? 0 0 0 0 0  Number falls in past yr: - 0 - 0 -  Injury with Fall? - 0 - 0 -  Risk for fall due to : Medication side effect - Medication side effect - -  Follow up Falls evaluation completed;Education provided;Falls prevention discussed - Falls evaluation completed;Education provided;Falls prevention discussed - -    FALL RISK PREVENTION PERTAINING TO THE HOME:  Any stairs in or around the home? Yes  If so, are there any without handrails? No  Home free of loose throw rugs in walkways, pet beds, electrical cords, etc? Yes  Adequate lighting in your home to reduce risk of falls? Yes   ASSISTIVE DEVICES UTILIZED TO PREVENT FALLS:  Life alert? No  Use of a cane, walker or w/c? No  Grab bars in the bathroom? Yes  Shower chair or bench in shower? Yes  Elevated toilet seat or a handicapped toilet? Yes    TIMED UP AND GO:  Was the test performed? No .      Cognitive Function:     6CIT Screen 01/22/2021 01/20/2020 12/19/2018 07/19/2017  What Year? 0 points 0 points 0 points 0 points  What month? 0 points 0 points 0 points 0 points  What time? 0 points 0 points 0 points 0 points  Count back from 20 0 points 0 points 0 points 0 points  Months in reverse 0 points 0 points 0 points 0 points  Repeat phrase 2 points 0 points 0 points 0 points  Total Score 2 0 0 0    Immunizations Immunization History  Administered Date(s) Administered   Influenza, High Dose Seasonal PF 05/17/2017, 05/25/2018   Influenza,inj,Quad PF,6+ Mos 07/14/2015   Influenza,inj,quad, With Preservative 03/27/2019   Influenza-Unspecified 05/26/2020   PFIZER(Purple Top)SARS-COV-2 Vaccination 07/31/2019, 08/20/2019, 02/26/2020, 10/27/2020   Pneumococcal Conjugate-13 02/03/2014   Pneumococcal Polysaccharide-23 03/14/2012   Tdap 01/25/2010   Zoster, Live 07/14/2015    TDAP status: Due, Education has been provided regarding the importance of this vaccine. Advised may receive this vaccine at local pharmacy or Health Dept. Aware to provide a copy of the vaccination record if obtained from local pharmacy or Health Dept. Verbalized acceptance and understanding.  Flu Vaccine status: Due, Education has been provided regarding the importance of this vaccine. Advised may receive this vaccine at local pharmacy or Health Dept. Aware to provide a copy of the vaccination record if obtained from local pharmacy or Health Dept. Verbalized acceptance and understanding.  Pneumococcal vaccine status: Up to date  Covid-19 vaccine status: Completed vaccines  Qualifies for Shingles Vaccine? Yes   Zostavax completed Yes   Shingrix Completed?: No.    Education has been provided regarding the importance of this vaccine. Patient has been advised to call insurance company to determine out of pocket expense if they have not yet received this  vaccine. Advised may also receive vaccine at local pharmacy or Health Dept. Verbalized acceptance and understanding.  Screening Tests Health Maintenance  Topic Date Due   Zoster Vaccines- Shingrix (1 of 2) Never done   TETANUS/TDAP  01/26/2020   INFLUENZA VACCINE  01/18/2021  COLONOSCOPY (Pts 45-36yr Insurance coverage will need to be confirmed)  09/06/2027   COVID-19 Vaccine  Completed   Hepatitis C Screening  Completed   PNA vac Low Risk Adult  Completed   HPV VACCINES  Aged Out    Health Maintenance  Health Maintenance Due  Topic Date Due   Zoster Vaccines- Shingrix (1 of 2) Never done   TETANUS/TDAP  01/26/2020   INFLUENZA VACCINE  01/18/2021    Colorectal cancer screening: No longer required.   Lung Cancer Screening: (Low Dose CT Chest recommended if Age 74-80years, 30 pack-year currently smoking OR have quit w/in 15years.) does not qualify.   Lung Cancer Screening Referral: no  Additional Screening:  Hepatitis C Screening: does qualify; Completed 07/14/2015  Vision Screening: Recommended annual ophthalmology exams for early detection of glaucoma and other disorders of the eye. Is the patient up to date with their annual eye exam?  Yes  Who is the provider or what is the name of the office in which the patient attends annual eye exams[facility] not established with a provider, would they like to be referred to a provider to establish care? No .   Dental Screening: Recommended annual dental exams for proper oral hygiene  Community Resource Referral / Chronic Care Management: CRR required this visit?  No   CCM required this visit?  No      Plan:     I have personally reviewed and noted the following in the patient's chart:   Medical and social history Use of alcohol, tobacco or illicit drugs  Current medications and supplements including opioid prescriptions. Patient is not currently taking opioid prescriptions. Functional ability and  status Nutritional status Physical activity Advanced directives List of other physicians Hospitalizations, surgeries, and ER visits in previous 12 months Vitals Screenings to include cognitive, depression, and falls Referrals and appointments  In addition, I have reviewed and discussed with patient certain preventive protocols, quality metrics, and best practice recommendations. A written personalized care plan for preventive services as well as general preventive health recommendations were provided to patient.     NKellie Simmering LPN   [id number]  Nurse Notes:

## 2021-05-06 IMAGING — CT CT CHEST LUNG CANCER SCREENING LOW DOSE W/O CM
2 of 5 series · 15 of 40 positions shown, 18 images · non-contrast
Comparison: 01/16/2019.

CLINICAL DATA: Lung cancer screening. 37.5 pack-year history.
Current asymptomatic smoker.

EXAM:
CT CHEST WITHOUT CONTRAST LOW-DOSE FOR LUNG CANCER SCREENING
TECHNIQUE: Multidetector CT imaging of the chest was performed following the
standard protocol without IV contrast.

[Series 3: lung 1.00 · axial · 0.67mm/px · z∈[+1694,+2014]mm · 12 of 354 slices shown, 15 images]
[im 17/354  mediastinal]
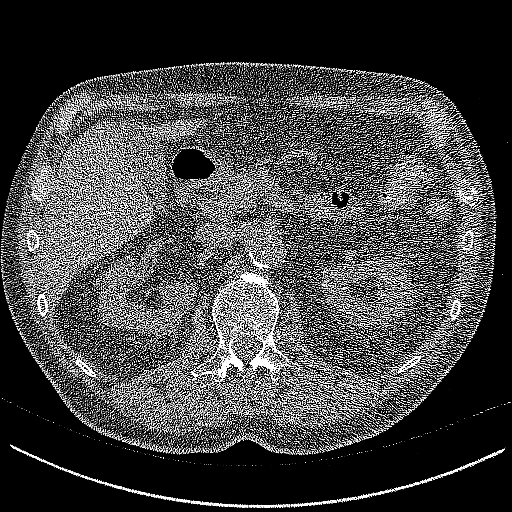
[im 17/354  lung]
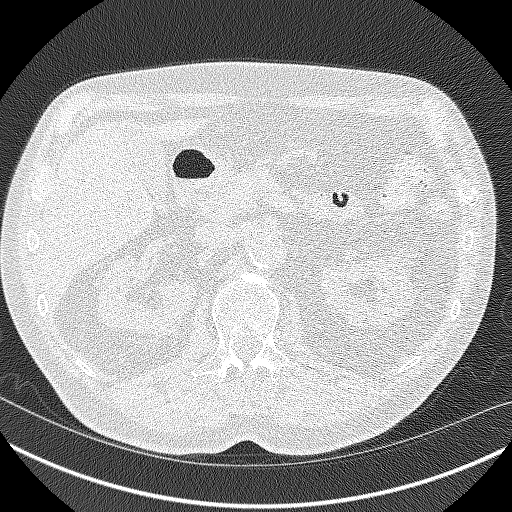
[im 49/354  lung]
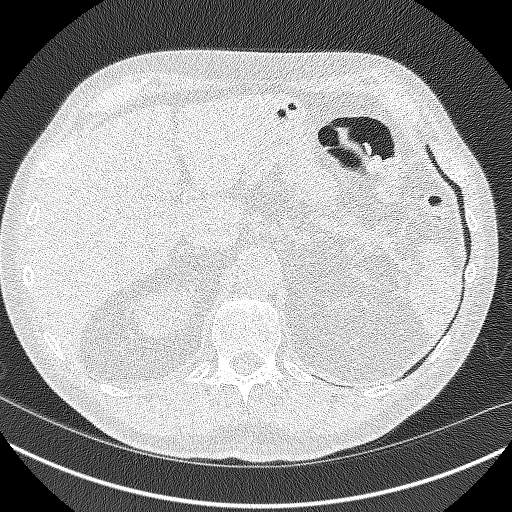
[im 81/354  lung]
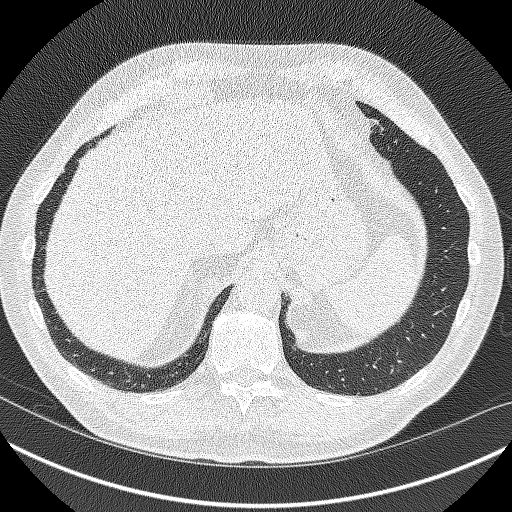
[im 113/354  lung]
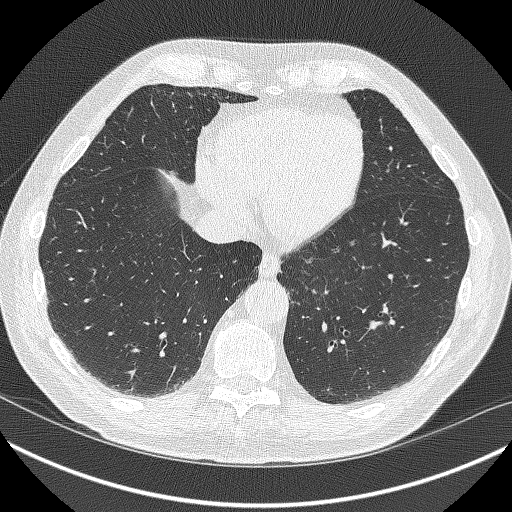
[im 129/354  mediastinal]
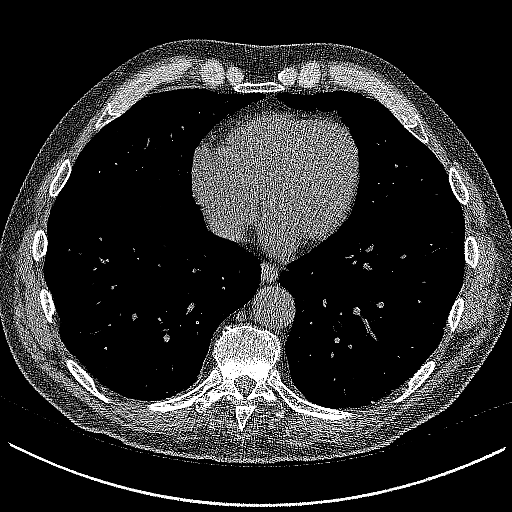
[im 129/354  lung]
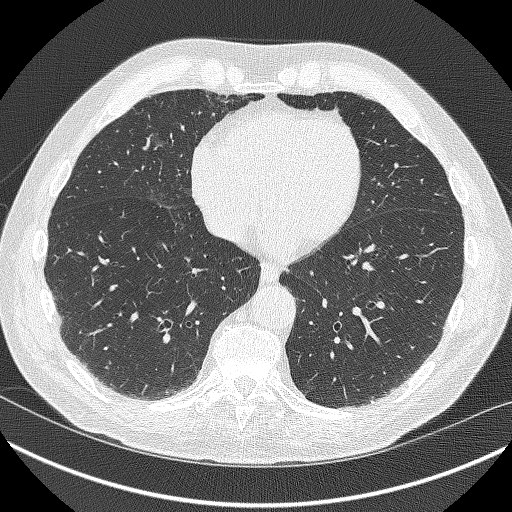
[im 161/354  lung]
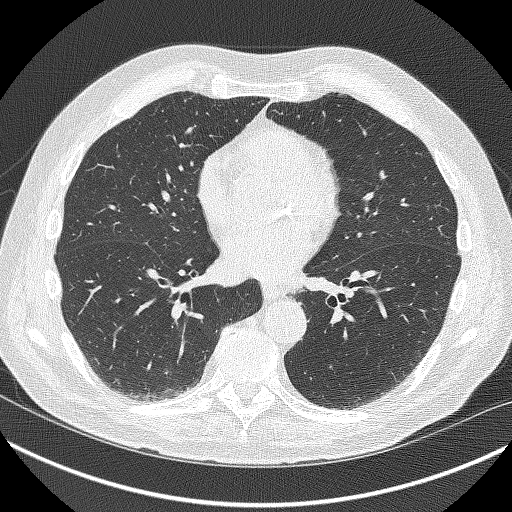
[im 193/354  lung]
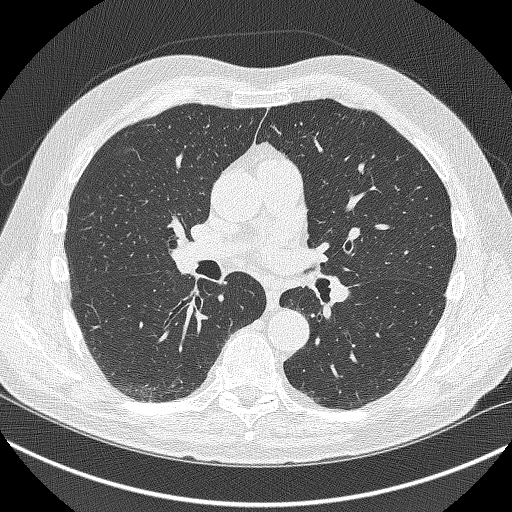
[im 225/354  lung]
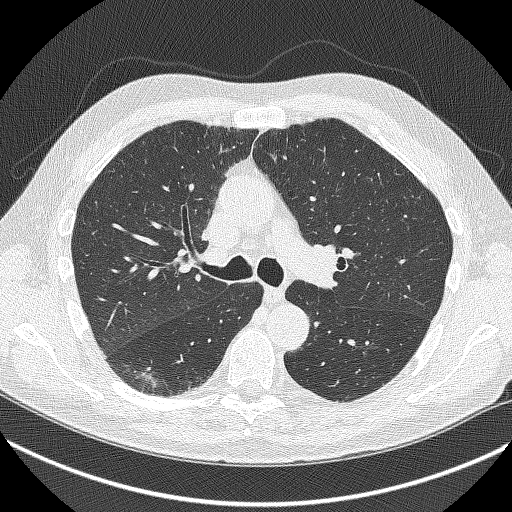
[im 241/354  mediastinal]
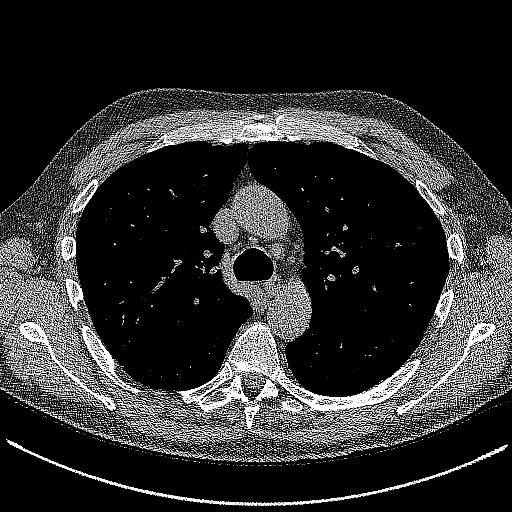
[im 241/354  lung]
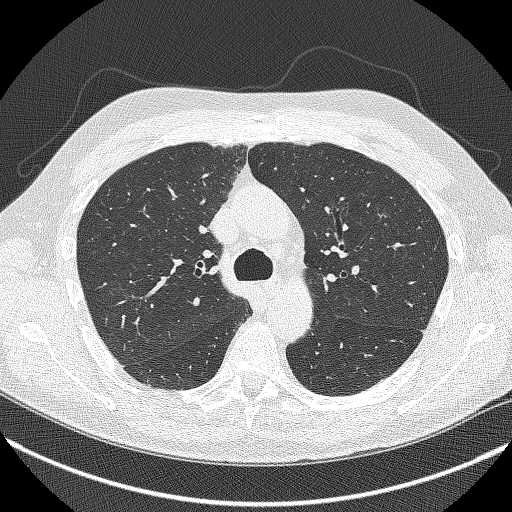
[im 273/354  lung]
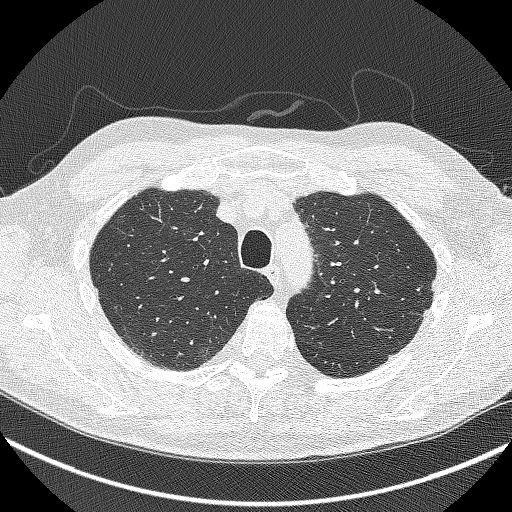
[im 305/354  lung]
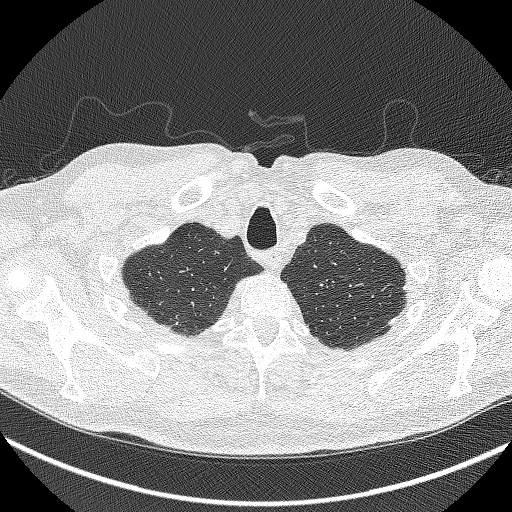
[im 337/354  lung]
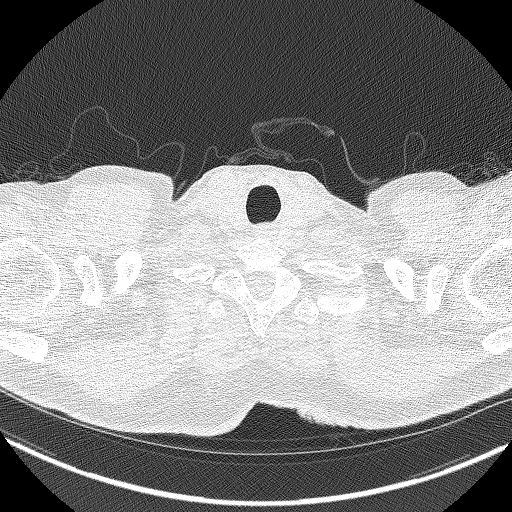

[Series 4: coronals lung 1.00 cor · coronal · 0.67mm/px · 3 of 284 slices shown]
[im 57/284  lung]
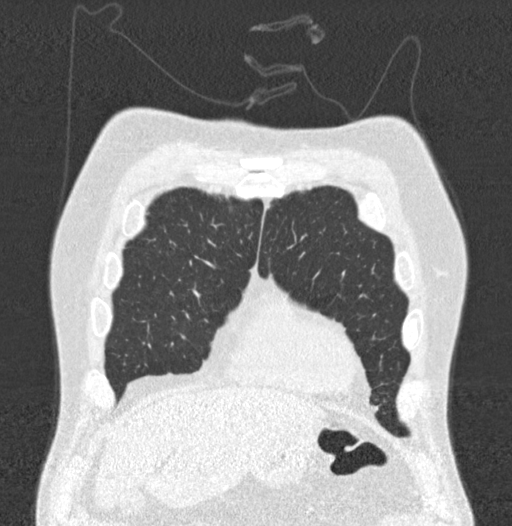
[im 114/284  lung]
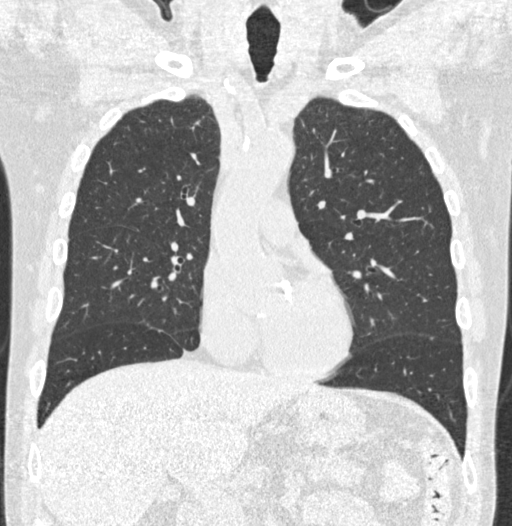
[im 170/284  lung]
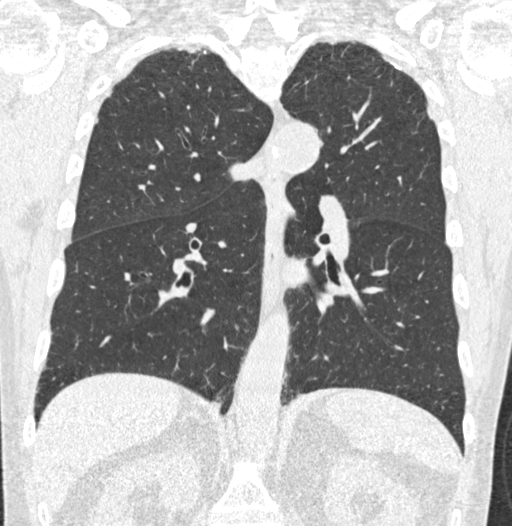

[15 of 40 positions shown; findings below may reference images not displayed]

FINDINGS: Cardiovascular: The heart size appears within normal limits. Aortic
atherosclerosis. Coronary artery calcifications. No pericardial
effusion.

Mediastinum/Nodes: No enlarged mediastinal, hilar, or axillary lymph
nodes. Thyroid gland, trachea, and esophagus demonstrate no
significant findings.

Lungs/Pleura: No pleural effusion, airspace consolidation or
atelectasis. Moderate emphysema scattered pulmonary nodules are
again identified. The largest nodule is non solid in appearance with
an equivalent diameter of 16.7 mm. These are all stable when
compared with the previous exam. No new or enlarging pulmonary
nodules.

Upper Abdomen: No acute abnormality. Left kidney stone is identified
measuring 3 mm.

Musculoskeletal: No chest wall mass or suspicious bone lesions
identified.
IMPRESSION: 1. Lung-RADS 2, benign appearance or behavior. Continue annual
screening with low-dose chest CT without contrast in 12 months.
2. Coronary artery calcifications.
3. Aortic atherosclerosis.

Aortic Atherosclerosis ([id number].T).

## 2021-07-21 ENCOUNTER — Telehealth: Payer: Self-pay | Admitting: Acute Care

## 2021-07-21 NOTE — Telephone Encounter (Signed)
Left voicemail and call back number to schedule annual LDCT °

## 2021-08-03 DIAGNOSIS — H401132 Primary open-angle glaucoma, bilateral, moderate stage: Secondary | ICD-10-CM | POA: Diagnosis not present

## 2021-08-09 DIAGNOSIS — H401132 Primary open-angle glaucoma, bilateral, moderate stage: Secondary | ICD-10-CM | POA: Diagnosis not present

## 2021-08-10 DIAGNOSIS — E782 Mixed hyperlipidemia: Secondary | ICD-10-CM | POA: Diagnosis not present

## 2021-08-10 DIAGNOSIS — I10 Essential (primary) hypertension: Secondary | ICD-10-CM | POA: Diagnosis not present

## 2021-08-10 DIAGNOSIS — I700 Atherosclerosis of aorta: Secondary | ICD-10-CM | POA: Diagnosis not present

## 2021-08-10 DIAGNOSIS — I493 Ventricular premature depolarization: Secondary | ICD-10-CM | POA: Diagnosis not present

## 2021-08-10 DIAGNOSIS — J439 Emphysema, unspecified: Secondary | ICD-10-CM | POA: Diagnosis not present

## 2021-08-12 ENCOUNTER — Other Ambulatory Visit: Payer: Self-pay

## 2021-08-12 ENCOUNTER — Ambulatory Visit (INDEPENDENT_AMBULATORY_CARE_PROVIDER_SITE_OTHER): Payer: Medicare Other | Admitting: Family Medicine

## 2021-08-12 ENCOUNTER — Other Ambulatory Visit: Payer: Self-pay | Admitting: Family Medicine

## 2021-08-12 ENCOUNTER — Encounter: Payer: Self-pay | Admitting: Family Medicine

## 2021-08-12 VITALS — BP 127/74 | HR 65 | Ht 70.5 in | Wt 180.4 lb

## 2021-08-12 DIAGNOSIS — Z8546 Personal history of malignant neoplasm of prostate: Secondary | ICD-10-CM

## 2021-08-12 DIAGNOSIS — E782 Mixed hyperlipidemia: Secondary | ICD-10-CM | POA: Diagnosis not present

## 2021-08-12 DIAGNOSIS — I10 Essential (primary) hypertension: Secondary | ICD-10-CM

## 2021-08-12 DIAGNOSIS — I493 Ventricular premature depolarization: Secondary | ICD-10-CM

## 2021-08-12 DIAGNOSIS — Z0000 Encounter for general adult medical examination without abnormal findings: Secondary | ICD-10-CM

## 2021-08-12 DIAGNOSIS — R7309 Other abnormal glucose: Secondary | ICD-10-CM

## 2021-08-12 NOTE — Patient Instructions (Addendum)
Thank you for coming to the office today.  Goal to improve hydration and add the sports drinks with electrolytes.  Leg cramps - Try spoonful of yellow mustard to relieve leg cramps or try daily to prevent the problem  - OTC natural option is Hyland's Leg Cramps (Dissolving tablet) take as needed for muscle cramps  ------------------------------------  Let me know if persistent restless leg issue is still present. We can consider Requip (Ropinirole) or Gabapentin as options for this.  START anti inflammatory topical - OTC Voltaren (generic Diclofenac) topical 2-4 times a day as needed for pain swelling of affected joint for 1-2 weeks or longer.  Recommend to start taking Tylenol Extra Strength 500mg  tabs - take 1 to 2 tabs per dose (max 1000mg ) every 6-8 hours for pain (take regularly, don't skip a dose for next 7 days), max 24 hour daily dose is 6 tablets or 3000mg . In the future you can repeat the same everyday Tylenol course for 1-2 weeks at a time.   DUE for FASTING BLOOD WORK (no food or drink after midnight before the lab appointment, only water or coffee without cream/sugar on the morning of)  SCHEDULE "Lab Only" visit in the morning at the clinic for lab draw in [place]   - Make sure Lab Only appointment is at about 1 week before your next appointment, so that results will be available  For Lab Results, once available within 2-3 days of blood draw, you can can log in to MyChart online to view your results and a brief explanation. Also, we can discuss results at next follow-up visit.   Please schedule a Follow-up Appointment to: Return in about 6 weeks (around 09/23/2021) for 6 weeks fasting lab only then 1 week later Annual Physical.  If you have any other questions or concerns, please feel free to call the office or send a message through [place]. You may also schedule an earlier appointment if necessary.  Additionally, you may be receiving a survey about your experience at our  office within a few days to 1 week by e-mail or mail. We value your feedback.  Nobie Putnam, DO [facility]

## 2021-08-12 NOTE — Progress Notes (Signed)
Subjective:    Patient ID: Richard Henry, male    DOB: 08-23-1945, 76 y.o.   MRN: [id number]  Richard Henry is a 76 y.o. male presenting on 08/12/2021 for Establish Care  Here to establish care with new PCP. Previously at [facility].  HPI  CHRONIC HTN: Reports history of prior ED visit for CP and heart arrhythmia or issue about 3-4 years ago, sent to Cardiology Dr Ubaldo Glassing.  Current Meds - Metoprolol XL 25mg  daily   Reports good compliance, took meds today. Tolerating well, w/o complaints. Denies CP, dyspnea, HA, edema, dizziness / lightheadedness  History of Prostate Cancer  S/p surgical removal of prostate 2014, at [facility]. He was having PSA checked as surveillance Last PSA 0.2 (11/2019) Fam history father prostate CA  HYPERLIPIDEMIA: - Reports no concerns. Last lipid panel 2021 - Currently taking Atorvastatin 40mg  AM, tolerating well without side effects or myalgias  Osteoarthritis Bilateral Knees Admits chronic issue, episodic stiffness, still very functional. He has not used medicine OTC or topical for it.  RLS Admits some difficulty with sense of restlessness in legs at times. Worse in evening. Never on medication or therapy  Leg Spasms Reports episodic, worse in PM, both legs. Not linked to trigger  Tobacco Use  Health Maintenance: Review records.  Depression screen [facility] 2/9 08/12/2021 01/22/2021 01/20/2020  Decreased Interest 0 0 0  Down, Depressed, Hopeless 0 0 0  PHQ - 2 Score 0 0 0  Altered sleeping 0 - -  Tired, decreased energy 0 - -  Change in appetite 0 - -  Feeling bad or failure about yourself  0 - -  Trouble concentrating 0 - -  Moving slowly or fidgety/restless 0 - -  Suicidal thoughts 0 - -  PHQ-9 Score 0 - -  Difficult doing work/chores Not difficult at all - -    Past Medical History:  Diagnosis Date   Cancer ([place])    prostate   Glaucoma    Hyperlipidemia    Hypertension    Personal history of tobacco use, presenting hazards to health 08/06/2015    Tobacco use disorder    Past Surgical History:  Procedure Laterality Date   COLONOSCOPY WITH PROPOFOL N/A 09/05/2017   Procedure: COLONOSCOPY WITH PROPOFOL;  Surgeon: Lucilla Lame, MD;  Location: [facility] ENDOSCOPY;  Service: Endoscopy;  Laterality: N/A;   EYE SURGERY     PROSTATE SURGERY  06/28/12   RETINAL DETACHMENT SURGERY     Social History   Socioeconomic History   Marital status: Married    Spouse name: Not on file   Number of children: Not on file   Years of education: Not on file   Highest education level: Some college, no degree  Occupational History   Occupation: retired  Tobacco Use   Smoking status: Some Days    Packs/day: 0.25    Years: 37.00    Pack years: 9.25    Types: Cigarettes   Smokeless tobacco: Never  Vaping Use   Vaping Use: Never used  Substance and Sexual Activity   Alcohol use: Yes    Alcohol/week: 12.0 standard drinks    Types: 12 Cans of beer per week   Drug use: No   Sexual activity: Not Currently  Other Topics Concern   Not on file  Social History Narrative   Golfing once a week    Social Determinants of Health   Financial Resource Strain: Low Risk    Difficulty of Paying Living Expenses: Not hard at all  Food Insecurity: No Food Insecurity   Worried About Charity fundraiser in the Last Year: Never true   Ran Out of Food in the Last Year: Never true  Transportation Needs: No Transportation Needs   Lack of Transportation (Medical): No   Lack of Transportation (Non-Medical): No  Physical Activity: Inactive   Days of Exercise per Week: 0 days   Minutes of Exercise per Session: 0 min  Stress: No Stress Concern Present   Feeling of Stress : Not at all  Social Connections: Not on file  Intimate Partner Violence: Not on file   Family History  Problem Relation Age of Onset   Hypertension Mother    Osteoporosis Mother    Cancer Father        lung   Hypertension Father    Current Outpatient Medications on File Prior to Visit   Medication Sig   atorvastatin (LIPITOR) 40 MG tablet TAKE 1 TABLET BY MOUTH  DAILY   bimatoprost (LUMIGAN) 0.01 % SOLN Apply to eye.   metoprolol succinate (TOPROL-XL) 25 MG 24 hr tablet Take 25 mg by mouth daily.   Multiple Vitamin (MULTIVITAMIN) tablet Take 1 tablet by mouth daily.   COMBIGAN 0.2-0.5 % ophthalmic solution Apply to eye. (Patient not taking: Reported on 01/22/2021)   No current facility-administered medications on file prior to visit.    Review of Systems Per HPI unless specifically indicated above      Objective:    BP 127/74    Pulse 65    Ht 5' 10.5" (1.791 m)    Wt 180 lb 6.4 oz (81.8 kg)    SpO2 100%    BMI 25.52 kg/m   Wt Readings from Last 3 Encounters:  08/12/21 180 lb 6.4 oz (81.8 kg)  01/22/21 170 lb (77.1 kg)  06/09/20 186 lb (84.4 kg)    Physical Exam Vitals and nursing note reviewed.  Constitutional:      General: He is not in acute distress.    Appearance: Normal appearance. He is well-developed. He is not diaphoretic.     Comments: Well-appearing, comfortable, cooperative  HENT:     Head: Normocephalic and atraumatic.  Eyes:     General:        Right eye: No discharge.        Left eye: No discharge.     Conjunctiva/sclera: Conjunctivae normal.  Cardiovascular:     Rate and Rhythm: Normal rate.  Pulmonary:     Effort: Pulmonary effort is normal.  Skin:    General: Skin is warm and dry.     Findings: No erythema or rash.  Neurological:     Mental Status: He is alert and oriented to person, place, and time.  Psychiatric:        Mood and Affect: Mood normal.        Behavior: Behavior normal.        Thought Content: Thought content normal.     Comments: Well groomed, good eye contact, normal speech and thoughts    2019 Echocardiogram stress test on 11/22/2017 revealed normal RV and LV systolic function with a resting and stress EF estimated greater than 55% with no significant valvular abnormalities.  Results for orders placed or  performed in visit on 82/64/15  Basic metabolic panel  Result Value Ref Range   Glucose 84 65 - 99 mg/dL   BUN 13 8 - 27 mg/dL   Creatinine, Ser 1.09 0.76 - 1.27 mg/dL   GFR calc non Af  Amer 67 >59 mL/min/1.73   GFR calc Af Amer 77 >59 mL/min/1.73   BUN/Creatinine Ratio 12 10 - 24   Sodium 141 134 - 144 mmol/L   Potassium 5.3 (H) 3.5 - 5.2 mmol/L   Chloride 102 96 - 106 mmol/L   CO2 26 20 - 29 mmol/L   Calcium 10.3 (H) 8.6 - 10.2 mg/dL  Lipid Panel w/o Chol/HDL Ratio  Result Value Ref Range   Cholesterol, Total 186 100 - 199 mg/dL   Triglycerides 78 0 - 149 mg/dL   HDL 57 >39 mg/dL   VLDL Cholesterol Cal 14 5 - 40 mg/dL   LDL Chol Calc (NIH) 115 (H) 0 - 99 mg/dL      Assessment & Plan:   Problem List Items Addressed This Visit     PVC's (premature ventricular contractions)   Hyperlipidemia   History of prostate cancer   Essential hypertension - Primary    Updated Health Maintenance information Reviewed recent lab results with patient Encouraged improvement to lifestyle with diet and exercise Goal of weight loss  Review records from Cardiology  HTN Continue current therapy, Metoprolol XL 25  HLD Continue Atorvastatin  Hx Prostate CA S/p surgical excision Check PSA for surveillance.  OA/DJD Discussed topical Voltaren, conservative care OTC options per AVS  No orders of the defined types were placed in this encounter.    Follow up plan: Return in about 6 weeks (around 09/23/2021) for 6 weeks fasting lab only then 1 week later Annual Physical.  Fasting labs 6 weeks 09/17/21  Nobie Putnam, DO [facility] 08/12/2021, 10:13 AM

## 2021-09-17 ENCOUNTER — Other Ambulatory Visit: Payer: Medicare Other

## 2021-09-17 DIAGNOSIS — Z0000 Encounter for general adult medical examination without abnormal findings: Secondary | ICD-10-CM

## 2021-09-17 DIAGNOSIS — I10 Essential (primary) hypertension: Secondary | ICD-10-CM

## 2021-09-17 DIAGNOSIS — R7309 Other abnormal glucose: Secondary | ICD-10-CM

## 2021-09-17 DIAGNOSIS — E782 Mixed hyperlipidemia: Secondary | ICD-10-CM | POA: Diagnosis not present

## 2021-09-17 DIAGNOSIS — Z8546 Personal history of malignant neoplasm of prostate: Secondary | ICD-10-CM

## 2021-09-18 LAB — CBC WITH DIFFERENTIAL/PLATELET
Absolute Monocytes: 752 cells/uL (ref 200–950)
Basophils Absolute: 48 cells/uL (ref 0–200)
Basophils Relative: 0.7 %
Eosinophils Absolute: 386 cells/uL (ref 15–500)
Eosinophils Relative: 5.6 %
HCT: 42.8 % (ref 38.5–50.0)
Hemoglobin: 14.3 g/dL (ref 13.2–17.1)
Lymphs Abs: 1987 cells/uL (ref 850–3900)
MCH: 31.2 pg (ref 27.0–33.0)
MCHC: 33.4 g/dL (ref 32.0–36.0)
MCV: 93.2 fL (ref 80.0–100.0)
MPV: 9.5 fL (ref 7.5–12.5)
Monocytes Relative: 10.9 %
Neutro Abs: 3726 cells/uL (ref 1500–7800)
Neutrophils Relative %: 54 %
Platelets: 289 10*3/uL (ref 140–400)
RBC: 4.59 10*6/uL (ref 4.20–5.80)
RDW: 12.3 % (ref 11.0–15.0)
Total Lymphocyte: 28.8 %
WBC: 6.9 10*3/uL (ref 3.8–10.8)

## 2021-09-18 LAB — HEMOGLOBIN A1C
Hgb A1c MFr Bld: 5.8 % of total Hgb — ABNORMAL HIGH (ref <5.7)
Mean Plasma Glucose: 120 mg/dL
eAG (mmol/L): 6.6 mmol/L

## 2021-09-18 LAB — COMPLETE METABOLIC PANEL WITH GFR
AG Ratio: 1.3 (calc) (ref 1.0–2.5)
ALT: 16 U/L (ref 9–46)
AST: 21 U/L (ref 10–35)
Albumin: 3.7 g/dL (ref 3.6–5.1)
Alkaline phosphatase (APISO): 58 U/L (ref 35–144)
BUN: 11 mg/dL (ref 7–25)
CO2: 28 mmol/L (ref 20–32)
Calcium: 9.4 mg/dL (ref 8.6–10.3)
Chloride: 105 mmol/L (ref 98–110)
Creat: 1.02 mg/dL (ref 0.70–1.28)
Globulin: 2.8 g/dL (calc) (ref 1.9–3.7)
Glucose, Bld: 87 mg/dL (ref 65–99)
Potassium: 4.8 mmol/L (ref 3.5–5.3)
Sodium: 140 mmol/L (ref 135–146)
Total Bilirubin: 0.5 mg/dL (ref 0.2–1.2)
Total Protein: 6.5 g/dL (ref 6.1–8.1)
eGFR: 77 mL/min/{1.73_m2} (ref >=60)

## 2021-09-18 LAB — TSH: TSH: 1.15 mIU/L (ref 0.40–4.50)

## 2021-09-18 LAB — LIPID PANEL
Cholesterol: 163 mg/dL (ref <200)
HDL: 52 mg/dL (ref >=40)
LDL Cholesterol (Calc): 94 mg/dL (calc)
Non-HDL Cholesterol (Calc): 111 mg/dL (calc) (ref <130)
Total CHOL/HDL Ratio: 3.1 (calc) (ref <5.0)
Triglycerides: 76 mg/dL (ref <150)

## 2021-09-18 LAB — PSA: PSA: 0.24 ng/mL (ref <=4.00)

## 2021-09-24 ENCOUNTER — Encounter: Payer: Self-pay | Admitting: Family Medicine

## 2021-09-24 ENCOUNTER — Ambulatory Visit (INDEPENDENT_AMBULATORY_CARE_PROVIDER_SITE_OTHER): Payer: Medicare Other | Admitting: Family Medicine

## 2021-09-24 ENCOUNTER — Other Ambulatory Visit: Payer: Self-pay | Admitting: Family Medicine

## 2021-09-24 VITALS — BP 124/66 | HR 62 | Ht 70.5 in | Wt 180.2 lb

## 2021-09-24 DIAGNOSIS — I493 Ventricular premature depolarization: Secondary | ICD-10-CM | POA: Diagnosis not present

## 2021-09-24 DIAGNOSIS — J432 Centrilobular emphysema: Secondary | ICD-10-CM

## 2021-09-24 DIAGNOSIS — Z0000 Encounter for general adult medical examination without abnormal findings: Secondary | ICD-10-CM

## 2021-09-24 DIAGNOSIS — I10 Essential (primary) hypertension: Secondary | ICD-10-CM

## 2021-09-24 DIAGNOSIS — E782 Mixed hyperlipidemia: Secondary | ICD-10-CM

## 2021-09-24 DIAGNOSIS — I700 Atherosclerosis of aorta: Secondary | ICD-10-CM

## 2021-09-24 DIAGNOSIS — Z8546 Personal history of malignant neoplasm of prostate: Secondary | ICD-10-CM

## 2021-09-24 DIAGNOSIS — R7309 Other abnormal glucose: Secondary | ICD-10-CM

## 2021-09-24 MED ORDER — ATORVASTATIN CALCIUM 40 MG PO TABS: 40.0000 mg | ORAL_TABLET | Freq: Every day | ORAL | 3 refills | Status: DC

## 2021-09-24 NOTE — Assessment & Plan Note (Signed)
Followed by Cardiology ?On BB ?

## 2021-09-24 NOTE — Assessment & Plan Note (Signed)
Controlled cholesterol on statin  ?The 10-year ASCVD risk score (Arnett DK, et al., 2019) is: 25.9% ? ?Plan: ?1. Continue current meds - Atorvastatin '40mg'$  daily ?2. Encourage improved lifestyle - low carb/cholesterol, reduce portion size, continue improving regular exercise ?Follow-up ? ?

## 2021-09-24 NOTE — Assessment & Plan Note (Signed)
Stable without flare ?Not on maintenance therapy ?

## 2021-09-24 NOTE — Assessment & Plan Note (Signed)
Stable, on prior imaging ?Continues on Statin therapy ?

## 2021-09-24 NOTE — Assessment & Plan Note (Signed)
Well-controlled HTN ? No known complications  ?  ?Plan:  ?1.  Continue current BP regimen Metoprolol XL '25mg'$  ?2. Encourage improved lifestyle - low sodium diet, regular exercise ?3. Continue monitor BP outside office, bring readings to next visit, if persistently >140/90 or new symptoms notify office sooner ?

## 2021-09-24 NOTE — Patient Instructions (Addendum)
Thank you for coming to the office today. ? ?[facility] ?Check with them for status of Shingles vaccine (new updated, Shingrix, 2 doses) ? ?Also check with status of TDap ? ?For Restless Legs ?- OTC natural option is Hyland's Restless Leg supplement - they have one for cramps and one for restless legs ? ?------------------- ? ? ?1. Chemistry - Normal results, including electrolytes, kidney and liver function. Normal fasting blood sugar  ?  ?2. Hemoglobin A1c (Diabetes screening) - 5.8, mild elevated in range of Pre-Diabetes (>5.7 to 6.4) - this is mild, only needs a little improvement on limiting carbs starches sugars, you do not need to change everything. ?  ?3. PSA Prostate Cancer SURVEILLANCE - 0.24 (similar to prior readings 2022) ?  ?4. TSH Thyroid Function Tests - Normal. ?  ?5. Cholesterol - Controlled on Atorvastatin ?  ?6. CBC Blood Counts - Normal, no anemia, other abnormality ? ?DUE for FASTING BLOOD WORK (no food or drink after midnight before the lab appointment, only water or coffee without cream/sugar on the morning of) ? ?SCHEDULE "Lab Only" visit in the morning at the clinic for lab draw in 1 YEAR ? ?- Make sure Lab Only appointment is at about 1 week before your next appointment, so that results will be available ? ?For Lab Results, once available within 2-3 days of blood draw, you can can log in to MyChart online to view your results and a brief explanation. Also, we can discuss results at next follow-up visit. ? ? ? ?Please schedule a Follow-up Appointment to: Return in about 1 year (around 09/25/2022) for 1 year fasting lab only then 1 week later Annual Physical. ? ?If you have any other questions or concerns, please feel free to call the office or send a message through [place]. You may also schedule an earlier appointment if necessary. ? ?Additionally, you may be receiving a survey about your experience at our office within a few days to 1 week by e-mail or mail. We value your  feedback. ? ?Nobie Putnam, DO ?[place] ?

## 2021-09-24 NOTE — Assessment & Plan Note (Signed)
PSA appropriate range for surveillance w history prostate cancer ?

## 2021-09-24 NOTE — Progress Notes (Signed)
? ?Subjective:  ? ? Patient ID: Richard Henry, male    DOB: 1945/08/14, 76 y.o.   MRN: [id number] ? ?Richard Henry is a 76 y.o. male presenting on 09/24/2021 for Annual Exam ? ? ?HPI ? ?Here for Annual Physical and Lab Review. ? ?Elevated A1c ?Result 5.8, no prior range ?He will continue to improvement. ? ?CHRONIC HTN: ?Reports history of prior ED visit for CP and heart arrhythmia or issue about 3-4 years ago, sent to Cardiology Dr Ubaldo Glassing.  ?Current Meds - Metoprolol XL '25mg'$  daily   ?Reports good compliance, took meds today. Tolerating well, w/o complaints. ?Denies CP, dyspnea, HA, edema, dizziness / lightheadedness ?  ?History of Prostate Cancer  ?S/p surgical removal of prostate 2014, at [facility]. ?He was having PSA checked as surveillance ?Last PSA 0.24 (08/2021) ?Fam history father prostate CA ?  ?HYPERLIPIDEMIA: ?- Reports no concerns. Last lipid panel 2023 ?- Currently taking Atorvastatin '40mg'$  AM, tolerating well without side effects or myalgias ?  ?Centrilobular Emphysema ?Stable. No flares or issues. ? ?Osteoarthritis Bilateral Knees ?Admits chronic issue, episodic stiffness, still very functional. He has not used medicine OTC or topical for it. ?  ?RLS ?Admits some difficulty with sense of restlessness in legs at times. Worse in evening. Never on medication or therapy ?  ?Leg Spasms ?Reports episodic, worse in PM, both legs. Not linked to trigger ?  ?Tobacco Use ?  ?Health Maintenance: ? ?Colonoscopy screening done in 09/05/17, Dr Allen Norris AGI, he can return in 10 years if indicated. ? ? ? ?  08/12/2021  ?  9:56 AM 01/22/2021  ? 11:17 AM 01/20/2020  ? 11:23 AM  ?Depression screen PHQ 2/9  ?Decreased Interest 0 0 0  ?Down, Depressed, Hopeless 0 0 0  ?PHQ - 2 Score 0 0 0  ?Altered sleeping 0    ?Tired, decreased energy 0    ?Change in appetite 0    ?Feeling bad or failure about yourself  0    ?Trouble concentrating 0    ?Moving slowly or fidgety/restless 0    ?Suicidal thoughts 0    ?PHQ-9 Score 0    ?Difficult doing  work/chores Not difficult at all    ? ? ?Past Medical History:  ?Diagnosis Date  ? Cancer [facility])   ? prostate  ? Glaucoma   ? Hyperlipidemia   ? Hypertension   ? Personal history of tobacco use, presenting hazards to health 08/06/2015  ? Tobacco use disorder   ? ?Past Surgical History:  ?Procedure Laterality Date  ? COLONOSCOPY WITH PROPOFOL N/A 09/05/2017  ? Procedure: COLONOSCOPY WITH PROPOFOL;  Surgeon: Lucilla Lame, MD;  Location: [facility] ENDOSCOPY;  Service: Endoscopy;  Laterality: N/A;  ? EYE SURGERY    ? PROSTATE SURGERY  06/28/12  ? RETINAL DETACHMENT SURGERY    ? ?Social History  ? ?Socioeconomic History  ? Marital status: Married  ?  Spouse name: Not on file  ? Number of children: Not on file  ? Years of education: Not on file  ? Highest education level: Some college, no degree  ?Occupational History  ? Occupation: retired  ?Tobacco Use  ? Smoking status: Some Days  ?  Packs/day: 0.25  ?  Years: 37.00  ?  Pack years: 9.25  ?  Types: Cigarettes  ? Smokeless tobacco: Never  ?Vaping Use  ? Vaping Use: Never used  ?Substance and Sexual Activity  ? Alcohol use: Yes  ?  Alcohol/week: 12.0 standard drinks  ?  Types: 12 Cans of  beer per week  ? Drug use: No  ? Sexual activity: Not Currently  ?Other Topics Concern  ? Not on file  ?Social History Narrative  ? Golfing once a week   ? ?Social Determinants of Health  ? ?Financial Resource Strain: Low Risk   ? Difficulty of Paying Living Expenses: Not hard at all  ?Food Insecurity: No Food Insecurity  ? Worried About Charity fundraiser in the Last Year: Never true  ? Ran Out of Food in the Last Year: Never true  ?Transportation Needs: No Transportation Needs  ? Lack of Transportation (Medical): No  ? Lack of Transportation (Non-Medical): No  ?Physical Activity: Inactive  ? Days of Exercise per Week: 0 days  ? Minutes of Exercise per Session: 0 min  ?Stress: No Stress Concern Present  ? Feeling of Stress : Not at all  ?Social Connections: Not on file  ?Intimate Partner  Violence: Not on file  ? ?Family History  ?Problem Relation Age of Onset  ? Hypertension Mother   ? Osteoporosis Mother   ? Cancer Father   ?     lung  ? Hypertension Father   ? ?Current Outpatient Medications on File Prior to Visit  ?Medication Sig  ? bimatoprost (LUMIGAN) 0.01 % SOLN Apply to eye.  ? metoprolol succinate (TOPROL-XL) 25 MG 24 hr tablet Take 25 mg by mouth daily.  ? Multiple Vitamin (MULTIVITAMIN) tablet Take 1 tablet by mouth daily.  ? COMBIGAN 0.2-0.5 % ophthalmic solution Apply to eye. (Patient not taking: Reported on 01/22/2021)  ? ?No current facility-administered medications on file prior to visit.  ? ? ?Review of Systems  ?Constitutional:  Negative for activity change, appetite change, chills, diaphoresis, fatigue and fever.  ?HENT:  Negative for congestion and hearing loss.   ?Eyes:  Negative for visual disturbance.  ?Respiratory:  Negative for cough, chest tightness, shortness of breath and wheezing.   ?Cardiovascular:  Negative for chest pain, palpitations and leg swelling.  ?Gastrointestinal:  Negative for abdominal pain, constipation, diarrhea, nausea and vomiting.  ?Genitourinary:  Negative for dysuria, frequency and hematuria.  ?Musculoskeletal:  Negative for arthralgias and neck pain.  ?Skin:  Negative for rash.  ?Neurological:  Negative for dizziness, weakness, light-headedness, numbness and headaches.  ?Hematological:  Negative for adenopathy.  ?Psychiatric/Behavioral:  Negative for behavioral problems, dysphoric mood and sleep disturbance.   ?Per HPI unless specifically indicated above ? ? ?   ?Objective:  ?  ?BP 124/66   Pulse 62   Ht 5' 10.5" (1.791 m)   Wt 180 lb 3.2 oz (81.7 kg)   SpO2 100%   BMI 25.49 kg/m?   ?Wt Readings from Last 3 Encounters:  ?09/24/21 180 lb 3.2 oz (81.7 kg)  ?08/12/21 180 lb 6.4 oz (81.8 kg)  ?01/22/21 170 lb (77.1 kg)  ?  ?Physical Exam ?Vitals and nursing note reviewed.  ?Constitutional:   ?   General: He is not in acute distress. ?   Appearance: He  is well-developed. He is not diaphoretic.  ?   Comments: Well-appearing, comfortable, cooperative  ?HENT:  ?   Head: Normocephalic and atraumatic.  ?Eyes:  ?   General:     ?   Right eye: No discharge.     ?   Left eye: No discharge.  ?   Conjunctiva/sclera: Conjunctivae normal.  ?   Pupils: Pupils are equal, round, and reactive to light.  ?Neck:  ?   Thyroid: No thyromegaly.  ?Cardiovascular:  ?  Rate and Rhythm: Normal rate and regular rhythm.  ?   Pulses: Normal pulses.  ?   Heart sounds: Normal heart sounds. No murmur heard. ?Pulmonary:  ?   Effort: Pulmonary effort is normal. No respiratory distress.  ?   Breath sounds: Normal breath sounds. No wheezing or rales.  ?Abdominal:  ?   General: Bowel sounds are normal. There is no distension.  ?   Palpations: Abdomen is soft. There is no mass.  ?   Tenderness: There is no abdominal tenderness.  ?Musculoskeletal:     ?   General: No tenderness. Normal range of motion.  ?   Cervical back: Normal range of motion and neck supple.  ?   Comments: Upper / Lower Extremities: ?- Normal muscle tone, strength bilateral upper extremities 5/5, lower extremities 5/5  ?Lymphadenopathy:  ?   Cervical: No cervical adenopathy.  ?Skin: ?   General: Skin is warm and dry.  ?   Findings: No erythema or rash.  ?Neurological:  ?   Mental Status: He is alert and oriented to person, place, and time.  ?   Comments: Distal sensation intact to light touch all extremities  ?Psychiatric:     ?   Mood and Affect: Mood normal.     ?   Behavior: Behavior normal.     ?   Thought Content: Thought content normal.  ?   Comments: Well groomed, good eye contact, normal speech and thoughts  ? ? ? ?Results for orders placed or performed in visit on 09/17/21  ?TSH  ?Result Value Ref Range  ? TSH 1.15 0.40 - 4.50 mIU/L  ?PSA  ?Result Value Ref Range  ? PSA 0.24 < OR = 4.00 ng/mL  ?Hemoglobin A1c  ?Result Value Ref Range  ? Hgb A1c MFr Bld 5.8 (H) <5.7 % of total Hgb  ? Mean Plasma Glucose 120 mg/dL  ? eAG  (mmol/L) 6.6 mmol/L  ?CBC with Differential/Platelet  ?Result Value Ref Range  ? WBC 6.9 3.8 - 10.8 Thousand/uL  ? RBC 4.59 4.20 - 5.80 Million/uL  ? Hemoglobin 14.3 13.2 - 17.1 g/dL  ? HCT 42.8 38.5 - 50.0 %  ? MCV 93.2 8

## 2022-01-24 ENCOUNTER — Ambulatory Visit: Payer: Medicare Other

## 2022-01-28 ENCOUNTER — Ambulatory Visit: Payer: Medicare Other

## 2022-02-07 DIAGNOSIS — H401132 Primary open-angle glaucoma, bilateral, moderate stage: Secondary | ICD-10-CM | POA: Diagnosis not present

## 2022-02-23 ENCOUNTER — Telehealth: Payer: Self-pay

## 2022-02-23 NOTE — Patient Outreach (Signed)
  Care Coordination   02/23/2022 Name: Richard Henry MRN: [id number] DOB: [birth year]   Care Coordination Outreach Attempts:  An unsuccessful telephone outreach was attempted today to offer the patient information about available care coordination services as a benefit of their health plan.   Follow Up Plan:  Additional outreach attempts will be made to offer the patient care coordination information and services.   Encounter Outcome:  No Answer  Care Coordination Interventions Activated:  No   Care Coordination Interventions:  No, not indicated    Noreene Larsson RN, MSN, CCM Community Care Coordinator [facility] Mobile: [phone]

## 2022-03-14 ENCOUNTER — Telehealth: Payer: Self-pay | Admitting: Family Medicine

## 2022-03-14 NOTE — Telephone Encounter (Signed)
Left message for patient to call back and schedule the Medicare Annual Wellness Visit (AWV) virtually or by telephone.  Last AWV 01/22/21  Please schedule at anytime with [facility].   Any questions, please call me at [phone]

## 2022-04-22 DIAGNOSIS — H401132 Primary open-angle glaucoma, bilateral, moderate stage: Secondary | ICD-10-CM | POA: Diagnosis not present

## 2022-05-30 ENCOUNTER — Other Ambulatory Visit: Payer: Self-pay | Admitting: Family Medicine

## 2022-05-30 DIAGNOSIS — E782 Mixed hyperlipidemia: Secondary | ICD-10-CM

## 2022-05-31 NOTE — Telephone Encounter (Signed)
Requested Prescriptions  Pending Prescriptions Disp Refills   atorvastatin (LIPITOR) 40 MG tablet [Pharmacy Med Name: Atorvastatin Calcium 40 MG Oral Tablet] 100 tablet 2    Sig: TAKE 1 TABLET BY MOUTH DAILY     Cardiovascular:  Antilipid - Statins Failed - 05/30/2022 10:56 PM      Failed - Lipid Panel in normal range within the last 12 months    Cholesterol, Total  Date Value Ref Range Status  06/09/2020 186 100 - 199 mg/dL Final   Cholesterol  Date Value Ref Range Status  09/17/2021 163 <200 mg/dL Final   Cholesterol Piccolo, Waived  Date Value Ref Range Status  05/17/2017 187 <200 mg/dL Final    Comment:                            Desirable                <200                         Borderline High      200- 239                         High                     >239    LDL Cholesterol (Calc)  Date Value Ref Range Status  09/17/2021 94 mg/dL (calc) Final    Comment:    Reference range: <100 . Desirable range <100 mg/dL for primary prevention;   <70 mg/dL for patients with CHD or diabetic patients  with > or = 2 CHD risk factors. Marland Kitchen LDL-C is now calculated using the Martin-Hopkins  calculation, which is a validated novel method providing  better accuracy than the Friedewald equation in the  estimation of LDL-C.  Cresenciano Genre et al. Annamaria Helling. 2620;355(97): 2061-2068  (http://education.[facility].com/faq/FAQ164)    HDL  Date Value Ref Range Status  09/17/2021 52 > OR = 40 mg/dL Final  06/09/2020 57 >39 mg/dL Final   Triglycerides  Date Value Ref Range Status  09/17/2021 76 <150 mg/dL Final   Triglycerides Piccolo,Waived  Date Value Ref Range Status  05/17/2017 75 <150 mg/dL Final    Comment:                            Normal                   <150                         Borderline High     150 - 199                         High                200 - 499                         Very High                >499          Passed - Patient is not pregnant       Passed - Valid encounter within last 12 months    Recent Outpatient  Visits           8 months ago Annual physical exam   [place], DO   9 months ago Essential hypertension   [place], Devonne Doughty, DO   1 year ago Centrilobular emphysema [facility])   [place], Henrine Screws T, NP   2 years ago Essential hypertension   [place], [place], [place]   2 years ago Essential hypertension   [place], [place], [place]

## 2022-06-27 DIAGNOSIS — R0602 Shortness of breath: Secondary | ICD-10-CM | POA: Diagnosis not present

## 2022-06-27 DIAGNOSIS — U071 COVID-19: Secondary | ICD-10-CM | POA: Diagnosis not present

## 2022-06-27 DIAGNOSIS — R059 Cough, unspecified: Secondary | ICD-10-CM | POA: Diagnosis not present

## 2022-08-08 ENCOUNTER — Other Ambulatory Visit: Payer: Self-pay | Admitting: Family Medicine

## 2022-08-08 DIAGNOSIS — E782 Mixed hyperlipidemia: Secondary | ICD-10-CM

## 2022-08-10 NOTE — Telephone Encounter (Signed)
Requested Prescriptions  Pending Prescriptions Disp Refills   atorvastatin (LIPITOR) 40 MG tablet [Pharmacy Med Name: Atorvastatin Calcium 40 MG Oral Tablet] 100 tablet 0    Sig: TAKE 1 TABLET BY MOUTH DAILY     Cardiovascular:  Antilipid - Statins Failed - 08/08/2022 10:41 PM      Failed - Lipid Panel in normal range within the last 12 months    Cholesterol, Total  Date Value Ref Range Status  06/09/2020 186 100 - 199 mg/dL Final   Cholesterol  Date Value Ref Range Status  09/17/2021 163 <200 mg/dL Final   Cholesterol Piccolo, Waived  Date Value Ref Range Status  05/17/2017 187 <200 mg/dL Final    Comment:                            Desirable                <200                         Borderline High      200- 239                         High                     >239    LDL Cholesterol (Calc)  Date Value Ref Range Status  09/17/2021 94 mg/dL (calc) Final    Comment:    Reference range: <100 . Desirable range <100 mg/dL for primary prevention;   <70 mg/dL for patients with CHD or diabetic patients  with > or = 2 CHD risk factors. Marland Kitchen LDL-C is now calculated using the Martin-Hopkins  calculation, which is a validated novel method providing  better accuracy than the Friedewald equation in the  estimation of LDL-C.  Cresenciano Genre et al. Annamaria Helling. [id number]): 2061-2068  (http://education.[facility].com/faq/FAQ164)    HDL  Date Value Ref Range Status  09/17/2021 52 > OR = 40 mg/dL Final  06/09/2020 57 >39 mg/dL Final   Triglycerides  Date Value Ref Range Status  09/17/2021 76 <150 mg/dL Final   Triglycerides Piccolo,Waived  Date Value Ref Range Status  05/17/2017 75 <150 mg/dL Final    Comment:                            Normal                   <150                         Borderline High     150 - 199                         High                200 - 499                         Very High                >499          Passed - Patient is not pregnant       Passed - Valid encounter within last 12 months    Recent Outpatient  Visits           10 months ago Annual physical exam   [facility] Olin Hauser, DO   12 months ago Essential hypertension   [facility] [place], Devonne Doughty, DO   2 years ago Centrilobular emphysema [facility])   [place], Henrine Screws T, NP   2 years ago Essential hypertension   [place], Rachel [place], [place]   3 years ago Essential hypertension   [place], Rachel [place], [place]

## 2022-08-11 DIAGNOSIS — I2510 Atherosclerotic heart disease of native coronary artery without angina pectoris: Secondary | ICD-10-CM | POA: Diagnosis not present

## 2022-08-11 DIAGNOSIS — E785 Hyperlipidemia, unspecified: Secondary | ICD-10-CM | POA: Diagnosis not present

## 2022-08-11 DIAGNOSIS — Z87891 Personal history of nicotine dependence: Secondary | ICD-10-CM | POA: Diagnosis not present

## 2022-08-11 DIAGNOSIS — I493 Ventricular premature depolarization: Secondary | ICD-10-CM | POA: Diagnosis not present

## 2022-08-11 DIAGNOSIS — I10 Essential (primary) hypertension: Secondary | ICD-10-CM | POA: Diagnosis not present

## 2022-08-19 DIAGNOSIS — H401132 Primary open-angle glaucoma, bilateral, moderate stage: Secondary | ICD-10-CM | POA: Diagnosis not present

## 2022-09-30 DIAGNOSIS — F17210 Nicotine dependence, cigarettes, uncomplicated: Secondary | ICD-10-CM | POA: Diagnosis not present

## 2022-09-30 DIAGNOSIS — Z1389 Encounter for screening for other disorder: Secondary | ICD-10-CM | POA: Diagnosis not present

## 2022-09-30 DIAGNOSIS — I10 Essential (primary) hypertension: Secondary | ICD-10-CM | POA: Diagnosis not present

## 2022-09-30 DIAGNOSIS — E7800 Pure hypercholesterolemia, unspecified: Secondary | ICD-10-CM | POA: Diagnosis not present

## 2022-09-30 DIAGNOSIS — Z0000 Encounter for general adult medical examination without abnormal findings: Secondary | ICD-10-CM | POA: Diagnosis not present

## 2022-10-04 DIAGNOSIS — I10 Essential (primary) hypertension: Secondary | ICD-10-CM | POA: Diagnosis not present

## 2022-10-04 DIAGNOSIS — Z7689 Persons encountering health services in other specified circumstances: Secondary | ICD-10-CM | POA: Diagnosis not present

## 2022-10-04 DIAGNOSIS — E7800 Pure hypercholesterolemia, unspecified: Secondary | ICD-10-CM | POA: Diagnosis not present

## 2022-10-04 DIAGNOSIS — R7309 Other abnormal glucose: Secondary | ICD-10-CM | POA: Diagnosis not present

## 2022-10-04 DIAGNOSIS — I700 Atherosclerosis of aorta: Secondary | ICD-10-CM | POA: Diagnosis not present

## 2022-10-17 ENCOUNTER — Other Ambulatory Visit: Payer: Self-pay | Admitting: Family Medicine

## 2022-10-17 DIAGNOSIS — E782 Mixed hyperlipidemia: Secondary | ICD-10-CM

## 2022-10-19 NOTE — Telephone Encounter (Signed)
Unable to refill per protocol, Rx request is too soon, patient needs CPE for additional refills.  Requested Prescriptions  Pending Prescriptions Disp Refills   atorvastatin (LIPITOR) 40 MG tablet [Pharmacy Med Name: Atorvastatin Calcium 40 MG Oral Tablet] 100 tablet 2    Sig: TAKE 1 TABLET BY MOUTH DAILY     Cardiovascular:  Antilipid - Statins Failed - 10/17/2022 10:11 PM      Failed - Valid encounter within last 12 months    Recent Outpatient Visits           1 year ago Annual physical exam   [place] [facility] Smitty Cords, DO   1 year ago Essential hypertension   [place] [facility] Smitty Cords, DO   2 years ago Centrilobular emphysema [facility])   [place] [facility], Corrie Dandy T, NP   2 years ago Essential hypertension   [place] [facility] Roosvelt Maser [place], [place]   3 years ago Essential hypertension    [facility] Roosvelt Maser [place], [place]              Failed - Lipid Panel in normal range within the last 12 months    Cholesterol, Total  Date Value Ref Range Status  06/09/2020 186 100 - 199 mg/dL Final   Cholesterol  Date Value Ref Range Status  09/17/2021 163 <200 mg/dL Final   Cholesterol Piccolo, [place]  Date Value Ref Range Status  05/17/2017 187 <200 mg/dL Final    Comment:                            Desirable                <200                         Borderline High      200- 239                         High                     >239    LDL Cholesterol (Calc)  Date Value Ref Range Status  09/17/2021 94 mg/dL (calc) Final    Comment:    Reference range: <100 . Desirable range <100 mg/dL for primary prevention;   <70 mg/dL for patients with CHD or diabetic patients  with > or = 2 CHD risk factors. Marland Kitchen LDL-C is now calculated using the Martin-Hopkins  calculation, which is a validated novel method providing  better  accuracy than the Friedewald equation in the  estimation of LDL-C.  Horald Pollen et al. Lenox Ahr. 1610;960(45): 2061-2068  (http://education.[facility].com/faq/FAQ164)    HDL  Date Value Ref Range Status  09/17/2021 52 > OR = 40 mg/dL Final  40/98/1191 57 >47 mg/dL Final   Triglycerides  Date Value Ref Range Status  09/17/2021 76 <150 mg/dL Final   Triglycerides Piccolo,Waived  Date Value Ref Range Status  05/17/2017 75 <150 mg/dL Final    Comment:                            Normal                   <150  Borderline High     150 - 199                         High                200 - 499                         Very High                >499          Passed - Patient is not pregnant

## 2022-12-30 DIAGNOSIS — H401132 Primary open-angle glaucoma, bilateral, moderate stage: Secondary | ICD-10-CM | POA: Diagnosis not present

## 2023-02-06 DIAGNOSIS — I10 Essential (primary) hypertension: Secondary | ICD-10-CM | POA: Diagnosis not present

## 2023-02-06 DIAGNOSIS — E7800 Pure hypercholesterolemia, unspecified: Secondary | ICD-10-CM | POA: Diagnosis not present

## 2023-02-06 DIAGNOSIS — Z87891 Personal history of nicotine dependence: Secondary | ICD-10-CM | POA: Diagnosis not present

## 2023-02-06 DIAGNOSIS — M25562 Pain in left knee: Secondary | ICD-10-CM | POA: Diagnosis not present

## 2023-02-08 DIAGNOSIS — M25562 Pain in left knee: Secondary | ICD-10-CM | POA: Diagnosis not present

## 2023-02-08 DIAGNOSIS — Z87891 Personal history of nicotine dependence: Secondary | ICD-10-CM | POA: Diagnosis not present

## 2023-02-08 DIAGNOSIS — E7800 Pure hypercholesterolemia, unspecified: Secondary | ICD-10-CM | POA: Diagnosis not present

## 2023-02-08 DIAGNOSIS — I700 Atherosclerosis of aorta: Secondary | ICD-10-CM | POA: Diagnosis not present

## 2023-02-08 DIAGNOSIS — R7309 Other abnormal glucose: Secondary | ICD-10-CM | POA: Diagnosis not present

## 2023-02-08 DIAGNOSIS — I10 Essential (primary) hypertension: Secondary | ICD-10-CM | POA: Diagnosis not present

## 2023-04-10 ENCOUNTER — Other Ambulatory Visit: Payer: Self-pay

## 2023-04-10 DIAGNOSIS — Z87891 Personal history of nicotine dependence: Secondary | ICD-10-CM

## 2023-04-10 DIAGNOSIS — Z122 Encounter for screening for malignant neoplasm of respiratory organs: Secondary | ICD-10-CM

## 2023-04-28 ENCOUNTER — Ambulatory Visit
Admission: RE | Admit: 2023-04-28 | Discharge: 2023-04-28 | Disposition: A | Discharge status: Home / Self Care | Payer: Medicare Other | Source: Ambulatory Visit | Attending: Acute Care | Admitting: Acute Care

## 2023-04-28 DIAGNOSIS — Z87891 Personal history of nicotine dependence: Secondary | ICD-10-CM | POA: Diagnosis not present

## 2023-04-28 DIAGNOSIS — Z122 Encounter for screening for malignant neoplasm of respiratory organs: Secondary | ICD-10-CM | POA: Diagnosis not present

## 2023-05-02 DIAGNOSIS — H401132 Primary open-angle glaucoma, bilateral, moderate stage: Secondary | ICD-10-CM | POA: Diagnosis not present

## 2023-05-02 DIAGNOSIS — Z961 Presence of intraocular lens: Secondary | ICD-10-CM | POA: Diagnosis not present

## 2023-05-04 ENCOUNTER — Other Ambulatory Visit: Payer: Self-pay | Admitting: Family Medicine

## 2023-05-04 DIAGNOSIS — E782 Mixed hyperlipidemia: Secondary | ICD-10-CM

## 2023-05-04 NOTE — Telephone Encounter (Signed)
Unable to refill per protocol, another provider listed as PCP.  Requested Prescriptions  Pending Prescriptions Disp Refills   atorvastatin (LIPITOR) 40 MG tablet [Pharmacy Med Name: Atorvastatin Calcium 40 MG Oral Tablet] 100 tablet 2    Sig: TAKE 1 TABLET BY MOUTH DAILY     Cardiovascular:  Antilipid - Statins Failed - 05/04/2023 10:37 AM      Failed - Valid encounter within last 12 months    Recent Outpatient Visits           1 year ago Annual physical exam   [place] [facility] Smitty Cords, DO   1 year ago Essential hypertension   [place] [facility] Smitty Cords, DO   2 years ago Centrilobular emphysema [facility])   [place] [facility] [place], Corrie Dandy T, NP   3 years ago Essential hypertension   [place] [facility] Roosvelt Maser [place], [place]   3 years ago Essential hypertension   [place] [facility] Roosvelt Maser [place], [place]              Failed - Lipid Panel in normal range within the last 12 months    Cholesterol, Total  Date Value Ref Range Status  06/09/2020 186 100 - 199 mg/dL Final   Cholesterol  Date Value Ref Range Status  09/17/2021 163 <200 mg/dL Final   Cholesterol Piccolo, [place]  Date Value Ref Range Status  05/17/2017 187 <200 mg/dL Final    Comment:                            Desirable                <200                         Borderline High      200- 239                         High                     >239    LDL Cholesterol (Calc)  Date Value Ref Range Status  09/17/2021 94 mg/dL (calc) Final    Comment:    Reference range: <100 . Desirable range <100 mg/dL for primary prevention;   <70 mg/dL for patients with CHD or diabetic patients  with > or = 2 CHD risk factors. Marland Kitchen LDL-C is now calculated using the Martin-Hopkins  calculation, which is a validated novel method providing  better accuracy than the Friedewald equation  in the  estimation of LDL-C.  Horald Pollen et al. Lenox Ahr. 1610;960(45): 2061-2068  (http://education.[facility].com/faq/FAQ164)    HDL  Date Value Ref Range Status  09/17/2021 52 > OR = 40 mg/dL Final  40/98/1191 57 >47 mg/dL Final   Triglycerides  Date Value Ref Range Status  09/17/2021 76 <150 mg/dL Final   Triglycerides Piccolo,Waived  Date Value Ref Range Status  05/17/2017 75 <150 mg/dL Final    Comment:                            Normal                   <150  Borderline High     150 - 199                         High                200 - 499                         Very High                >499          Passed - Patient is not pregnant

## 2023-05-26 ENCOUNTER — Other Ambulatory Visit: Payer: Self-pay | Admitting: Acute Care

## 2023-05-26 DIAGNOSIS — Z122 Encounter for screening for malignant neoplasm of respiratory organs: Secondary | ICD-10-CM

## 2023-05-26 DIAGNOSIS — Z87891 Personal history of nicotine dependence: Secondary | ICD-10-CM

## 2023-06-30 DIAGNOSIS — H401132 Primary open-angle glaucoma, bilateral, moderate stage: Secondary | ICD-10-CM | POA: Diagnosis not present

## 2023-06-30 DIAGNOSIS — Z961 Presence of intraocular lens: Secondary | ICD-10-CM | POA: Diagnosis not present

## 2023-07-17 DIAGNOSIS — I493 Ventricular premature depolarization: Secondary | ICD-10-CM | POA: Diagnosis not present

## 2023-08-03 DIAGNOSIS — R7309 Other abnormal glucose: Secondary | ICD-10-CM | POA: Diagnosis not present

## 2023-08-03 DIAGNOSIS — E7800 Pure hypercholesterolemia, unspecified: Secondary | ICD-10-CM | POA: Diagnosis not present

## 2023-08-03 DIAGNOSIS — I10 Essential (primary) hypertension: Secondary | ICD-10-CM | POA: Diagnosis not present

## 2023-08-03 DIAGNOSIS — E782 Mixed hyperlipidemia: Secondary | ICD-10-CM | POA: Diagnosis not present

## 2023-08-03 DIAGNOSIS — F17210 Nicotine dependence, cigarettes, uncomplicated: Secondary | ICD-10-CM | POA: Diagnosis not present

## 2023-10-09 DIAGNOSIS — R0981 Nasal congestion: Secondary | ICD-10-CM | POA: Diagnosis not present

## 2023-10-09 DIAGNOSIS — I10 Essential (primary) hypertension: Secondary | ICD-10-CM | POA: Diagnosis not present

## 2023-10-09 DIAGNOSIS — Z23 Encounter for immunization: Secondary | ICD-10-CM | POA: Diagnosis not present

## 2023-10-09 DIAGNOSIS — Z0000 Encounter for general adult medical examination without abnormal findings: Secondary | ICD-10-CM | POA: Diagnosis not present

## 2023-10-09 DIAGNOSIS — Z1389 Encounter for screening for other disorder: Secondary | ICD-10-CM | POA: Diagnosis not present

## 2023-10-09 DIAGNOSIS — Z87891 Personal history of nicotine dependence: Secondary | ICD-10-CM | POA: Diagnosis not present

## 2023-11-02 DIAGNOSIS — H401132 Primary open-angle glaucoma, bilateral, moderate stage: Secondary | ICD-10-CM | POA: Diagnosis not present

## 2023-12-14 DIAGNOSIS — H401132 Primary open-angle glaucoma, bilateral, moderate stage: Secondary | ICD-10-CM | POA: Diagnosis not present

## 2023-12-28 DIAGNOSIS — I2510 Atherosclerotic heart disease of native coronary artery without angina pectoris: Secondary | ICD-10-CM | POA: Diagnosis not present

## 2023-12-28 DIAGNOSIS — Z87891 Personal history of nicotine dependence: Secondary | ICD-10-CM | POA: Diagnosis not present

## 2023-12-28 DIAGNOSIS — I10 Essential (primary) hypertension: Secondary | ICD-10-CM | POA: Diagnosis not present

## 2023-12-28 DIAGNOSIS — I700 Atherosclerosis of aorta: Secondary | ICD-10-CM | POA: Diagnosis not present

## 2023-12-28 DIAGNOSIS — E785 Hyperlipidemia, unspecified: Secondary | ICD-10-CM | POA: Diagnosis not present

## 2023-12-28 DIAGNOSIS — I493 Ventricular premature depolarization: Secondary | ICD-10-CM | POA: Diagnosis not present

## 2024-01-15 DIAGNOSIS — I2510 Atherosclerotic heart disease of native coronary artery without angina pectoris: Secondary | ICD-10-CM | POA: Diagnosis not present

## 2024-03-11 DIAGNOSIS — H401132 Primary open-angle glaucoma, bilateral, moderate stage: Secondary | ICD-10-CM | POA: Diagnosis not present

## 2024-03-22 DIAGNOSIS — H401132 Primary open-angle glaucoma, bilateral, moderate stage: Secondary | ICD-10-CM | POA: Diagnosis not present

## 2024-04-01 DIAGNOSIS — Z23 Encounter for immunization: Secondary | ICD-10-CM | POA: Diagnosis not present

## 2024-04-01 DIAGNOSIS — Z87891 Personal history of nicotine dependence: Secondary | ICD-10-CM | POA: Diagnosis not present

## 2024-04-01 DIAGNOSIS — Z0000 Encounter for general adult medical examination without abnormal findings: Secondary | ICD-10-CM | POA: Diagnosis not present

## 2024-04-01 DIAGNOSIS — I700 Atherosclerosis of aorta: Secondary | ICD-10-CM | POA: Diagnosis not present

## 2024-04-01 DIAGNOSIS — R7309 Other abnormal glucose: Secondary | ICD-10-CM | POA: Diagnosis not present

## 2024-04-01 DIAGNOSIS — I10 Essential (primary) hypertension: Secondary | ICD-10-CM | POA: Diagnosis not present

## 2024-04-01 DIAGNOSIS — J432 Centrilobular emphysema: Secondary | ICD-10-CM | POA: Diagnosis not present

## 2024-04-04 ENCOUNTER — Encounter: Payer: Self-pay | Admitting: Acute Care

## 2024-04-08 DIAGNOSIS — I10 Essential (primary) hypertension: Secondary | ICD-10-CM | POA: Diagnosis not present

## 2024-04-08 DIAGNOSIS — L299 Pruritus, unspecified: Secondary | ICD-10-CM | POA: Diagnosis not present

## 2024-04-08 DIAGNOSIS — J439 Emphysema, unspecified: Secondary | ICD-10-CM | POA: Diagnosis not present

## 2024-04-08 DIAGNOSIS — I700 Atherosclerosis of aorta: Secondary | ICD-10-CM | POA: Diagnosis not present

## 2024-04-08 DIAGNOSIS — Z87891 Personal history of nicotine dependence: Secondary | ICD-10-CM | POA: Diagnosis not present

## 2024-04-08 DIAGNOSIS — Z0000 Encounter for general adult medical examination without abnormal findings: Secondary | ICD-10-CM | POA: Diagnosis not present

## 2024-04-08 DIAGNOSIS — R197 Diarrhea, unspecified: Secondary | ICD-10-CM | POA: Diagnosis not present

## 2024-04-08 DIAGNOSIS — Z23 Encounter for immunization: Secondary | ICD-10-CM | POA: Diagnosis not present

## 2024-04-10 ENCOUNTER — Other Ambulatory Visit: Payer: Self-pay | Admitting: Internal Medicine

## 2024-04-10 DIAGNOSIS — R197 Diarrhea, unspecified: Secondary | ICD-10-CM

## 2024-04-10 DIAGNOSIS — R634 Abnormal weight loss: Secondary | ICD-10-CM

## 2024-04-10 DIAGNOSIS — Z23 Encounter for immunization: Secondary | ICD-10-CM

## 2024-04-19 ENCOUNTER — Ambulatory Visit
Admission: RE | Admit: 2024-04-19 | Discharge: 2024-04-19 | Disposition: A | Discharge status: Home / Self Care | Source: Ambulatory Visit | Attending: Internal Medicine | Admitting: Internal Medicine

## 2024-04-19 DIAGNOSIS — R197 Diarrhea, unspecified: Secondary | ICD-10-CM | POA: Insufficient documentation

## 2024-04-19 DIAGNOSIS — Z23 Encounter for immunization: Secondary | ICD-10-CM | POA: Insufficient documentation

## 2024-04-19 DIAGNOSIS — R634 Abnormal weight loss: Secondary | ICD-10-CM | POA: Insufficient documentation

## 2024-04-19 MED ORDER — IOHEXOL 300 MG/ML  SOLN
100.0000 mL | Freq: Once | INTRAMUSCULAR | Status: AC | PRN
  Administered 2024-04-19: 100 mL via INTRAVENOUS

## 2024-04-29 ENCOUNTER — Ambulatory Visit
Admission: RE | Admit: 2024-04-29 | Discharge: 2024-04-29 | Disposition: A | Discharge status: Home / Self Care | Source: Ambulatory Visit | Attending: Acute Care | Admitting: Acute Care

## 2024-04-29 DIAGNOSIS — Z122 Encounter for screening for malignant neoplasm of respiratory organs: Secondary | ICD-10-CM | POA: Diagnosis present

## 2024-04-29 DIAGNOSIS — Z87891 Personal history of nicotine dependence: Secondary | ICD-10-CM | POA: Insufficient documentation
